# Patient Record
Sex: Female | Born: 1985 | Race: Black or African American | Hispanic: No | Marital: Single | Smoking: Current every day smoker
Health system: Southern US, Community
[De-identification: ages and names within clinical notes are randomized; demographics above are authoritative.]

## PROBLEM LIST (undated history)

## (undated) ENCOUNTER — Inpatient Hospital Stay: Payer: Self-pay

## (undated) DIAGNOSIS — I10 Essential (primary) hypertension: Secondary | ICD-10-CM

## (undated) DIAGNOSIS — F329 Major depressive disorder, single episode, unspecified: Secondary | ICD-10-CM

## (undated) DIAGNOSIS — K219 Gastro-esophageal reflux disease without esophagitis: Secondary | ICD-10-CM

## (undated) DIAGNOSIS — O479 False labor, unspecified: Secondary | ICD-10-CM

## (undated) DIAGNOSIS — R011 Cardiac murmur, unspecified: Secondary | ICD-10-CM

## (undated) DIAGNOSIS — F32A Depression, unspecified: Secondary | ICD-10-CM

## (undated) DIAGNOSIS — A6000 Herpesviral infection of urogenital system, unspecified: Secondary | ICD-10-CM

## (undated) DIAGNOSIS — F419 Anxiety disorder, unspecified: Secondary | ICD-10-CM

## (undated) DIAGNOSIS — R87619 Unspecified abnormal cytological findings in specimens from cervix uteri: Secondary | ICD-10-CM

## (undated) HISTORY — PX: OTHER SURGICAL HISTORY: SHX169

## (undated) HISTORY — PX: DILATION AND CURETTAGE OF UTERUS: SHX78

## (undated) HISTORY — DX: False labor, unspecified: O47.9

## (undated) HISTORY — DX: Herpesviral infection of urogenital system, unspecified: A60.00

---

## 2005-12-26 ENCOUNTER — Emergency Department: Payer: Self-pay | Admitting: Emergency Medicine

## 2005-12-29 ENCOUNTER — Ambulatory Visit: Payer: Self-pay | Admitting: Emergency Medicine

## 2006-03-27 ENCOUNTER — Emergency Department: Payer: Self-pay | Admitting: Emergency Medicine

## 2006-10-12 ENCOUNTER — Emergency Department: Payer: Self-pay | Admitting: Internal Medicine

## 2007-08-07 ENCOUNTER — Emergency Department: Payer: Self-pay | Admitting: Unknown Physician Specialty

## 2007-10-24 ENCOUNTER — Observation Stay: Payer: Self-pay

## 2007-11-16 ENCOUNTER — Observation Stay: Payer: Self-pay

## 2007-12-07 ENCOUNTER — Observation Stay: Payer: Self-pay | Admitting: Obstetrics and Gynecology

## 2007-12-11 ENCOUNTER — Observation Stay: Payer: Self-pay

## 2007-12-11 ENCOUNTER — Inpatient Hospital Stay: Payer: Self-pay | Admitting: Obstetrics and Gynecology

## 2009-07-13 ENCOUNTER — Ambulatory Visit: Payer: Self-pay | Admitting: Internal Medicine

## 2011-05-30 ENCOUNTER — Emergency Department: Payer: Self-pay | Admitting: Unknown Physician Specialty

## 2011-05-31 LAB — URINALYSIS, COMPLETE
Bilirubin,UR: NEGATIVE
Blood: NEGATIVE
Glucose,UR: NEGATIVE mg/dL (ref 0–75)
Ketone: NEGATIVE
Leukocyte Esterase: NEGATIVE
Protein: 30
RBC,UR: NONE SEEN /HPF (ref 0–5)
Specific Gravity: 1.024 (ref 1.003–1.030)
Squamous Epithelial: 11

## 2011-05-31 LAB — CBC
MCHC: 33 g/dL (ref 32.0–36.0)
MCV: 96 fL (ref 80–100)
Platelet: 197 10*3/uL (ref 150–440)
RBC: 3.96 10*6/uL (ref 3.80–5.20)
RDW: 11.7 % (ref 11.5–14.5)

## 2011-05-31 LAB — COMPREHENSIVE METABOLIC PANEL
Albumin: 3.4 g/dL (ref 3.4–5.0)
Alkaline Phosphatase: 67 U/L (ref 50–136)
Anion Gap: 9 (ref 7–16)
BUN: 13 mg/dL (ref 7–18)
Bilirubin,Total: 0.4 mg/dL (ref 0.2–1.0)
Creatinine: 0.68 mg/dL (ref 0.60–1.30)
Glucose: 111 mg/dL — ABNORMAL HIGH (ref 65–99)
Osmolality: 278 (ref 275–301)
Potassium: 3.5 mmol/L (ref 3.5–5.1)
SGPT (ALT): 28 U/L
Sodium: 139 mmol/L (ref 136–145)
Total Protein: 7.8 g/dL (ref 6.4–8.2)

## 2012-10-09 ENCOUNTER — Emergency Department: Payer: Self-pay | Admitting: Emergency Medicine

## 2012-10-09 LAB — URINALYSIS, COMPLETE
Bilirubin,UR: NEGATIVE
Glucose,UR: NEGATIVE mg/dL (ref 0–75)
Ketone: NEGATIVE
Specific Gravity: 1.023 (ref 1.003–1.030)
WBC UR: 138 /HPF (ref 0–5)

## 2013-04-05 ENCOUNTER — Emergency Department: Payer: Self-pay | Admitting: Emergency Medicine

## 2013-08-16 ENCOUNTER — Encounter: Payer: Self-pay | Admitting: Physician Assistant

## 2014-07-21 HISTORY — PX: APPENDECTOMY: SHX54

## 2014-07-24 LAB — URINALYSIS, COMPLETE
BLOOD: NEGATIVE
Bacteria: NONE SEEN
Bilirubin,UR: NEGATIVE
Glucose,UR: NEGATIVE mg/dL (ref 0–75)
KETONE: NEGATIVE
Leukocyte Esterase: NEGATIVE
Nitrite: NEGATIVE
PH: 7 (ref 4.5–8.0)
RBC,UR: 4 /HPF (ref 0–5)
SPECIFIC GRAVITY: 1.027 (ref 1.003–1.030)
Squamous Epithelial: 15
WBC UR: 3 /HPF (ref 0–5)

## 2014-07-24 LAB — CBC WITH DIFFERENTIAL/PLATELET
BASOS ABS: 0 10*3/uL (ref 0.0–0.1)
Basophil %: 0.3 %
Eosinophil #: 0.1 10*3/uL (ref 0.0–0.7)
Eosinophil %: 0.6 %
HCT: 39.2 % (ref 35.0–47.0)
HGB: 13.3 g/dL (ref 12.0–16.0)
LYMPHS PCT: 16.1 %
Lymphocyte #: 2.5 10*3/uL (ref 1.0–3.6)
MCH: 32.8 pg (ref 26.0–34.0)
MCHC: 34 g/dL (ref 32.0–36.0)
MCV: 97 fL (ref 80–100)
MONO ABS: 0.9 x10 3/mm (ref 0.2–0.9)
Monocyte %: 5.7 %
NEUTROS PCT: 77.3 %
Neutrophil #: 11.9 10*3/uL — ABNORMAL HIGH (ref 1.4–6.5)
PLATELETS: 213 10*3/uL (ref 150–440)
RBC: 4.06 10*6/uL (ref 3.80–5.20)
RDW: 13.1 % (ref 11.5–14.5)
WBC: 15.3 10*3/uL — ABNORMAL HIGH (ref 3.6–11.0)

## 2014-07-25 ENCOUNTER — Observation Stay
Admit: 2014-07-25 | Disposition: A | Discharge status: Home / Self Care | Payer: Self-pay | Attending: Surgery | Admitting: Surgery

## 2014-07-25 LAB — COMPREHENSIVE METABOLIC PANEL
ANION GAP: 7 (ref 7–16)
Albumin: 4 g/dL
Alkaline Phosphatase: 56 U/L
BUN: 17 mg/dL
Bilirubin,Total: 0.4 mg/dL
CALCIUM: 9.4 mg/dL
CO2: 25 mmol/L
Chloride: 104 mmol/L
Creatinine: 0.6 mg/dL
EGFR (African American): >60
EGFR (Non-African Amer.): >60
Glucose: 109 mg/dL — ABNORMAL HIGH
Potassium: 3.3 mmol/L — ABNORMAL LOW
SGOT(AST): 22 U/L
SGPT (ALT): 18 U/L
Sodium: 136 mmol/L
Total Protein: 7.6 g/dL

## 2014-08-14 LAB — SURGICAL PATHOLOGY

## 2014-08-20 NOTE — H&P (Signed)
PATIENT NAME:  Karen Wallace, Karen Wallace MR#:  [id number] DATE OF BIRTH:  [birth year]  DATE OF ADMISSION:  07/25/2014  ADMITTING PHYSICIAN:  Quentin Orealph L. Ely III, MD   CHIEF COMPLAINT: Abdominal pain.   BRIEF HISTORY: Karen Wallace is a 29 year old woman seen in the Emergency Room with a less than 24 hour history of abdominal pain. She awoke yesterday morning feeling normal, developed some mild back pain later in the morning followed by some significant right-sided pain. The pain increased over the course of the day. It was not associated with any nausea, vomiting, fever, chills. She did have some anorexia and has not eaten since yesterday morning. She had a loose stool the day before which she related to something she ate. The pain persisted over the course of the day and she presented to the Emergency Room for further evaluation.   She denies a history of hepatitis, yellow jaundice, pancreatitis, peptic ulcer disease, gallbladder disease, or diverticulitis. She has had no previous abdominal surgery. She has had 1 child with normal vaginal delivery and had 1 miscarriage followed by a Wallace and C.   PAST MEDICAL HISTORY AND MEDICATIONS: She has a history of hypertension, currently under therapy. She does not have her medication with her and does not remember the name.   ALLERGIES: SHE FEELS AS THOUGH SHE IS ALLERGIC TO ONE ANTIHYPERTENSIVE MEDICATION, WHICH DID GIVE HER SOME CHRONIC COUGH ISSUES.   SOCIAL HISTORY:  She smokes cigarettes at approximately half a  pack a day and drinks alcohol occasionally, works in a dietary department. She does not do physical labor.   FAMILY HISTORY: Noncontributory to the current problem but is positive for hypertension.   REVIEW OF SYSTEMS: Otherwise unremarkable. Specifically, she has no shortness of breath, chest pain, or urinary symptoms. The current pain is not related to any of her normal menstrual cycle.   PHYSICAL EXAMINATION:  GENERAL: She is alert, a very pleasant  woman lying at rest in bed complaining of minimal abdominal discomfort at the present time. She has been treated with pain medication. Rates her pain as a 0 at rest.  VITAL SIGNS: Blood pressure is 140/80. Heart rate is 92 and regular. She is afebrile.  HEENT: No scleral icterus. No pupillary abnormalities. Normal ears.  NECK: Supple, nontender with midline trachea.  LYMPHATICS: Lymphadenopathy is not present in the axilla or her cervical areas.  CHEST: Clear with no adventitious sounds and she has normal pulmonary excursion.  CARDIAC: No murmurs or gallops to my ear. She seems to be in normal sinus rhythm.  ABDOMEN: Soft with moderate right lower quadrant point tenderness, mild guarding, and mild rebound. She has active bowel sounds.  EXTREMITIES: The lower extremity and upper extremity exam reveal full range of motion, no deformities although she does admit to some numbness in the left leg, which is due to a chronic back condition. She has normal distal pulses.   DICTATION ENDS HERE  see addendum  ____________________________ Carmie Endalph L. Ely III, MD rle:AT Wallace: 07/25/2014 04:49:03 ET T: 07/25/2014 05:34:56 ET JOB#: [id number]  cc: Quentin Orealph L. Ely III, MD, <Dictator> Quentin OreALPH L ELY MD ELECTRONICALLY SIGNED 07/25/2014 19:34

## 2014-08-20 NOTE — Op Note (Signed)
PATIENT NAME:  Karen AbbottROGERS, [name] MR#:  [id number] DATE OF BIRTH:  [birth year]  DATE OF PROCEDURE:  07/25/2014  PREOPERATIVE DIAGNOSIS: Acute appendicitis.   POSTOPERATIVE DIAGNOSIS: Acute appendicitis.   PROCEDURE: Laparoscopic appendectomy.   ANESTHESIA: General with endotracheal tube.   SURGEON:  Dionne Miloichard Damaria Stofko, MD.    ASSISTANT:  Doreene NestElena Klaus, PA-S.   INDICATIONS: This is a patient with progressive right lower quadrant pain and tenderness and workup showing probable appendicitis. Preoperatively we discussed rationale for surgery, the options of observation, risks of bleeding, infection, recurrence, negative laparoscopy, and an open procedure.  This was all reviewed for her. She understood and agreed to proceed.   FINDINGS: Acute appendicitis, nonruptured.   DESCRIPTION OF PROCEDURE: The patient was induced to general anesthesia. She was on IV antibiotics. VTE prophylaxis was in place and Foley catheter was placed. She was then prepped and draped in a sterile fashion. Marcaine was infiltrated in skin and subcutaneous tissues around the periumbilical area. Incision was made. Veress needle was placed. Pneumoperitoneum was obtained. A 5 mm trocar port was placed. The abdominal cavity was explored and under direct vision a 5 mm suprapubic port and left lateral 12 mm port was placed. The appendix was identified in the right lower quadrant. It was elevated. The base of the appendix was handled with a standard load Endo GIA and then 2 firings of the vascular load. Endo GIA was utilized to control and divide the mesoappendix. There was bleeding from the staple line, therefore clips were utilized. The specimen was passed out through the lateral port site with the aid of an Endo Catch bag. The area was irrigated with copious amounts of normal saline, which was aspirated. There was no further bleeding. Therefore pneumoperitoneum was maintained while closing the left lateral port site under direct vision with  an Endo Close technique and 0 Vicryl sutures. Again hemostasis was checked and found to be adequate. No other pathology was noted.  Pneumoperitoneum was released. All ports were removed. 4-0 subcuticular Monocryl was used on all skin edges. Steri-Strips, Mastisol, and sterile dressings were placed.   The patient tolerated the procedure well. There were no complications. She was taken to the recovery room in stable condition, to be admitted for continued care.    ____________________________ Adah Salvageichard E. Excell Seltzerooper, MD rec:bu D: 07/25/2014 12:19:35 ET T: 07/25/2014 13:24:18 ET JOB#: [id number]  cc: Adah Salvageichard E. Excell Seltzerooper, MD, <Dictator> Lattie HawICHARD E Landyn Buckalew MD ELECTRONICALLY SIGNED 07/25/2014 19:00

## 2014-08-20 NOTE — Discharge Summary (Signed)
PATIENT NAME:  Karen Wallace, Karen D MR#:  [id number] DATE OF BIRTH:  02-Dec-1985  DATE OF ADMISSION:  07/25/2014 DATE OF DISCHARGE:  07/26/2014  DIAGNOSES: Acute appendicitis.   PROCEDURE: Laparoscopic appendectomy.   HISTORY OF PRESENT ILLNESS AND HOSPITAL COURSE: This is a patient with progressive right lower quadrant pain and tenderness, and a work-up showing acute appendicitis. She was taken to the operating room, where a laparoscopic appendectomy was performed. She made an uncomplicated postoperative recovery, is tolerating a regular diet, is discharged on oral analgesics, with instructions to remove her dressing and shower the next day and to follow up in our office in 10 days.     ____________________________ Adah Salvageichard E. Excell Seltzerooper, MD rec:mw D: 08/02/2014 14:23:49 ET T: 08/02/2014 17:00:54 ET JOB#: [id number]  cc: Adah Salvageichard E. Excell Seltzerooper, MD, <Dictator> Lattie HawICHARD E Damontre Millea MD ELECTRONICALLY SIGNED 08/07/2014 20:37

## 2014-08-20 NOTE — H&P (Signed)
PATIENT NAME:  Karen Wallace, Karen Wallace MR#:  [id number] DATE OF BIRTH:  [birth year]  DATE OF ADMISSION:  07/25/2014  ADDENDUM   NEUROLOGIC: No evidence for any cranial nerve abnormalities, and she has equal symmetrical bilateral reflexes.  PSYCHIATRIC: Normal orientation. Normal affect.  SKIN:  Multiple tattoos but no masses, abrasions, contusions.   LABORATORY DATA: Workup in the Emergency Room revealed white blood cell count of 15,000. The only electrolyte abnormality was a potassium of 3.3. CT exam revealed dilated appendix 10 mm with some moderate periappendiceal stranding suggestive of acute appendicitis.   ASSESSMENT AND PLAN: I independently reviewed her CT scan. Her imaging, physical examination, and history are consistent with early appendicitis. She is symptomatic. 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 In this setting we would recommend admission to the hospital and consideration for surgical intervention. I talked with her about the options of antibiotic therapy versus surgical intervention. We outlined the risks of possible rupture and the sequelae from ruptured appendicitis. She would like to consider surgery. We will set her up for surgery later this morning.    ____________________________ Carmie Endalph L. Ely III, MD rle:AT Wallace: 07/25/2014 04:51:08 ET T: 07/25/2014 05:48:38 ET JOB#: [id number]  cc: Quentin Orealph L. Ely III, MD, <Dictator> Quentin OreALPH L ELY MD ELECTRONICALLY SIGNED 07/25/2014 19:34

## 2014-10-05 LAB — OB RESULTS CONSOLE HEPATITIS B SURFACE ANTIGEN: Hepatitis B Surface Ag: NEGATIVE

## 2014-10-05 LAB — OB RESULTS CONSOLE HIV ANTIBODY (ROUTINE TESTING): HIV: NONREACTIVE

## 2014-11-26 ENCOUNTER — Encounter: Payer: Self-pay | Admitting: Emergency Medicine

## 2014-11-26 ENCOUNTER — Emergency Department
Admission: EM | Admit: 2014-11-26 | Discharge: 2014-11-26 | Disposition: A | Discharge status: Home / Self Care | Payer: Medicaid Other | Attending: Student | Admitting: Student

## 2014-11-26 DIAGNOSIS — E860 Dehydration: Secondary | ICD-10-CM

## 2014-11-26 DIAGNOSIS — R531 Weakness: Secondary | ICD-10-CM | POA: Insufficient documentation

## 2014-11-26 DIAGNOSIS — O23591 Infection of other part of genital tract in pregnancy, first trimester: Secondary | ICD-10-CM | POA: Diagnosis not present

## 2014-11-26 DIAGNOSIS — Z3A13 13 weeks gestation of pregnancy: Secondary | ICD-10-CM | POA: Diagnosis not present

## 2014-11-26 DIAGNOSIS — F17210 Nicotine dependence, cigarettes, uncomplicated: Secondary | ICD-10-CM | POA: Insufficient documentation

## 2014-11-26 DIAGNOSIS — O10011 Pre-existing essential hypertension complicating pregnancy, first trimester: Secondary | ICD-10-CM | POA: Diagnosis not present

## 2014-11-26 DIAGNOSIS — N760 Acute vaginitis: Secondary | ICD-10-CM

## 2014-11-26 DIAGNOSIS — O211 Hyperemesis gravidarum with metabolic disturbance: Secondary | ICD-10-CM | POA: Insufficient documentation

## 2014-11-26 DIAGNOSIS — O99331 Smoking (tobacco) complicating pregnancy, first trimester: Secondary | ICD-10-CM | POA: Diagnosis not present

## 2014-11-26 DIAGNOSIS — O9989 Other specified diseases and conditions complicating pregnancy, childbirth and the puerperium: Secondary | ICD-10-CM | POA: Diagnosis present

## 2014-11-26 DIAGNOSIS — B9689 Other specified bacterial agents as the cause of diseases classified elsewhere: Secondary | ICD-10-CM

## 2014-11-26 DIAGNOSIS — R42 Dizziness and giddiness: Secondary | ICD-10-CM

## 2014-11-26 HISTORY — DX: Essential (primary) hypertension: I10

## 2014-11-26 LAB — CBC
HCT: 33.4 % — ABNORMAL LOW (ref 35.0–47.0)
Hemoglobin: 11.9 g/dL — ABNORMAL LOW (ref 12.0–16.0)
MCH: 33.4 pg (ref 26.0–34.0)
MCHC: 35.5 g/dL (ref 32.0–36.0)
MCV: 93.9 fL (ref 80.0–100.0)
Platelets: 248 10*3/uL (ref 150–440)
RBC: 3.56 MIL/uL — AB (ref 3.80–5.20)
RDW: 13.1 % (ref 11.5–14.5)
WBC: 12.4 10*3/uL — ABNORMAL HIGH (ref 3.6–11.0)

## 2014-11-26 LAB — URINALYSIS COMPLETE WITH MICROSCOPIC (ARMC ONLY)
Bilirubin Urine: NEGATIVE
GLUCOSE, UA: NEGATIVE mg/dL
Hgb urine dipstick: NEGATIVE
NITRITE: NEGATIVE
PH: 7 (ref 5.0–8.0)
PROTEIN: 30 mg/dL — AB
Specific Gravity, Urine: 1.024 (ref 1.005–1.030)

## 2014-11-26 LAB — COMPREHENSIVE METABOLIC PANEL
ALBUMIN: 3.7 g/dL (ref 3.5–5.0)
ALT: 21 U/L (ref 14–54)
ANION GAP: 9 (ref 5–15)
AST: 19 U/L (ref 15–41)
Alkaline Phosphatase: 45 U/L (ref 38–126)
BUN: 13 mg/dL (ref 6–20)
CO2: 21 mmol/L — AB (ref 22–32)
Calcium: 9.7 mg/dL (ref 8.9–10.3)
Chloride: 103 mmol/L (ref 101–111)
Creatinine, Ser: 0.46 mg/dL (ref 0.44–1.00)
GFR calc Af Amer: >60 mL/min (ref >60)
GFR calc non Af Amer: >60 mL/min (ref >60)
Glucose, Bld: 86 mg/dL (ref 65–99)
Potassium: 3.9 mmol/L (ref 3.5–5.1)
SODIUM: 133 mmol/L — AB (ref 135–145)
Total Bilirubin: 0.3 mg/dL (ref 0.3–1.2)
Total Protein: 7 g/dL (ref 6.5–8.1)

## 2014-11-26 LAB — CHLAMYDIA/NGC RT PCR (ARMC ONLY)
Chlamydia Tr: NOT DETECTED
N GONORRHOEAE: NOT DETECTED

## 2014-11-26 LAB — HCG, QUANTITATIVE, PREGNANCY: hCG, Beta Chain, Quant, S: 112375 m[IU]/mL — ABNORMAL HIGH (ref <5)

## 2014-11-26 LAB — LIPASE, BLOOD: Lipase: 18 U/L — ABNORMAL LOW (ref 22–51)

## 2014-11-26 LAB — WET PREP, GENITAL
Trich, Wet Prep: NONE SEEN
Yeast Wet Prep HPF POC: NONE SEEN

## 2014-11-26 MED ORDER — DEXTROSE 5 % AND 0.9 % NACL IV BOLUS
500.0000 mL | Freq: Once | INTRAVENOUS | Status: AC
  Administered 2014-11-26: 500 mL via INTRAVENOUS
  Filled 2014-11-26: qty 1000
  Filled 2014-11-26: qty 500

## 2014-11-26 MED ORDER — SODIUM CHLORIDE 0.9 % IV BOLUS (SEPSIS)
1000.0000 mL | Freq: Once | INTRAVENOUS | Status: AC
  Administered 2014-11-26: 1000 mL via INTRAVENOUS

## 2014-11-26 MED ORDER — METRONIDAZOLE 500 MG PO TABS: 500.0000 mg | ORAL_TABLET | Freq: Two times a day (BID) | ORAL | Status: DC

## 2014-11-26 MED ORDER — ACETAMINOPHEN 500 MG PO TABS
1000.0000 mg | ORAL_TABLET | Freq: Once | ORAL | Status: AC
  Administered 2014-11-26: 1000 mg via ORAL
  Filled 2014-11-26: qty 2

## 2014-11-26 NOTE — ED Notes (Signed)
Pt with be d/c once fluids finish per MD gayle. Pt made aware and verbalized understanding at this time. No further orders at this time.

## 2014-11-26 NOTE — ED Provider Notes (Signed)
[facility] Emergency Department Provider Note  ____________________________________________  Time seen: Approximately 3:07 PM  I have reviewed the triage vital signs and the nursing notes.   HISTORY  Chief Complaint Pelvic Pain; Abdominal Pain; and Weakness    HPI Karen Wallace is a 29 y.o. female G3 P1 at 38 weeks estimated gestational age by dementia and first trimester ultrasound performed on 11/23/14 who presents for evaluation of 2 days of generalized weakness, fatigue, lightheadedness, generally feeling poor, gradual onset. She has had white vaginal discharge which she is concerned may be related to a yeast infection. She has had intermittent lower abdominal pains ongoing for several weeks. No abnormal vaginal bleeding. She had one episode of nonbloody nonbilious emesis today; she has had morning sickness throughout this pregnancy. No diarrhea, no fevers or chills. Current severity of symptoms is moderate. No chest pain, no difficulty breathing. No modifying factors.   Past Medical History  Diagnosis Date  . Hypertension     There are no active problems to display for this patient.   Past Surgical History  Procedure Laterality Date  . Appendectomy  april 2016    No current outpatient prescriptions on file.  Allergies Review of patient's allergies indicates no known allergies.  No family history on file.  Social History History  Substance Use Topics  . Smoking status: Current Every Day Smoker    Types: Cigarettes  . Smokeless tobacco: Not on file  . Alcohol Use: No    Review of Systems Constitutional: No fever/chills Eyes: No visual changes. ENT: No sore throat. Cardiovascular: Denies chest pain. Respiratory: Denies shortness of breath. Gastrointestinal: + abdominal pain.  + nausea, + vomiting.  No diarrhea.  No constipation. Genitourinary: Negative for dysuria. Musculoskeletal: Negative for back pain. Skin: Negative for  rash. Neurological: Negative for headaches, focal weakness or numbness.  10-point ROS otherwise negative.  ____________________________________________   PHYSICAL EXAM:  VITAL SIGNS: ED Triage Vitals  Enc Vitals Group     BP 11/26/14 1423 129/79 mmHg     Pulse Rate 11/26/14 1423 101     Resp 11/26/14 1423 16     Temp 11/26/14 1423 98.3 F (36.8 C)     Temp Source 11/26/14 1423 Oral     SpO2 11/26/14 1423 97 %     Weight 11/26/14 1423 203 lb (92.08 kg)     Height 11/26/14 1423  (1.727 m)     Head Cir --      Peak Flow --      Pain Score 11/26/14 1424 6     Pain Loc --      Pain Edu? --      Excl. in GC? --     Constitutional: Alert and oriented. Well appearing and in no acute distress. Eyes: Conjunctivae are normal. PERRL. EOMI. Head: Atraumatic. Nose: No congestion/rhinnorhea. Mouth/Throat: Mucous membranes are moist.  Oropharynx non-erythematous. Neck: No stridor.  Cardiovascular: Normal rate, regular rhythm. Grossly normal heart sounds.  Good peripheral circulation. Respiratory: Normal respiratory effort.  No retractions. Lungs CTAB. Gastrointestinal: Soft and nontender. No distention. No abdominal bruits. No CVA tenderness. Pelvic:: Thick white  discharge from close os. No CMT or BMT. Musculoskeletal: No lower extremity tenderness nor edema.  No joint effusions. Neurologic:  Normal speech and language. No gross focal neurologic deficits are appreciated. No gait instability. Skin:  Skin is warm, dry and intact. No rash noted. Psychiatric: Mood and affect are normal. Speech and behavior are normal.  ____________________________________________  LABS (all labs ordered are listed, but only abnormal results are displayed)  Labs Reviewed  LIPASE, BLOOD - Abnormal; Notable for the following:    Lipase 18 (*)    All other components within normal limits  COMPREHENSIVE METABOLIC PANEL - Abnormal; Notable for the following:    Sodium 133 (*)    CO2 21 (*)     All other components within normal limits  CBC - Abnormal; Notable for the following:    WBC 12.4 (*)    RBC 3.56 (*)    Hemoglobin 11.9 (*)    HCT 33.4 (*)    All other components within normal limits  HCG, QUANTITATIVE, PREGNANCY - Abnormal; Notable for the following:    hCG, Beta Chain, [name] [id number] (*)    All other components within normal limits  URINALYSIS COMPLETEWITH MICROSCOPIC ([facility] ONLY) - Abnormal; Notable for the following:    Color, Urine YELLOW (*)    APPearance CLOUDY (*)    Ketones, ur 2+ (*)    Protein, ur 30 (*)    Leukocytes, UA 1+ (*)    Bacteria, UA RARE (*)    Squamous Epithelial / LPF 6-30 (*)    All other components within normal limits  WET PREP, GENITAL   ____________________________________________  EKG  none ____________________________________________  RADIOLOGY  Limited emergency department bedside ultrasound performed by me shows single live intrauterine pregnancy with fetal heart rate 167 bpm. ____________________________________________   PROCEDURES  Procedure(s) performed: None  Critical Care performed: No  ____________________________________________   INITIAL IMPRESSION / ASSESSMENT AND PLAN / ED COURSE  Pertinent labs & imaging results that were available during my care of the patient were reviewed by me and considered in my medical decision making (see chart for details).  Karen Wallace is a 29 y.o. female G3 P1 at 58 weeks estimated gestational age by dementia. An first trimester ultrasound who presents for evaluation of 2 days of generalized weakness, fatigue, lightheadedness, generally feeling poor. On exam, she is generally well-appearing and in no acute distress. Mildly tachycardic on arrival however this has resolved at this time. She has a completely benign abdominal examination. Labs are notable for chronic leukocytosis which is stable, improved from prior. Mild hyponatremia. Mild anemia. Urinalysis contaminated  with multiple squamous cells, 1+ leuk esterase and rare bacteria, not consistent with infection, culture sent. She has a 2+ ketones in her urine. I suspect mild dehydration as a cause of her symptoms. We'll give IV fluids containing dextrose, Tylenol for any discomfort. We will perform a pelvic exam and anticipate discharge with OB/GYN follow-up.  ----------------------------------------- 5:46 PM on 11/26/2014 -----------------------------------------  Patient with improvement of her symptoms after IV fluids. Wet prep consistent with BV and since she is symptomatic, will treat with Flagyl. Discharge with return precautions, close OB/GYN follow-up, instructions to push fluids orally. She is comfortable with the discharge plan. GC chlamydia pending. ____________________________________________   FINAL CLINICAL IMPRESSION(S) / ED DIAGNOSES  Final diagnoses:  Lightheadedness  Dehydration      Gayla Doss, MD 11/26/14 [phone]

## 2014-11-26 NOTE — ED Notes (Addendum)
Pt reports she is [redacted] weeks pregnant, reports lower abdominal pain and pelvic pain today. Pt reports weakness since Friday. Pt tearful in triage, states "I just don't feel good". Pt reports decreased appetite, reports nausea and vomiting. Pt also reports white vaginal discharge with itching that started this morning.

## 2014-11-26 NOTE — ED Notes (Signed)
MD Gayle at bedside. 

## 2014-11-26 NOTE — ED Notes (Signed)
Pt ambulatory to bathroom at this time with no concerns.  

## 2014-11-26 NOTE — ED Notes (Signed)
Pharmacy called regarding dextrose, will send to ED

## 2015-04-11 ENCOUNTER — Observation Stay
Admission: EM | Admit: 2015-04-11 | Discharge: 2015-04-11 | Disposition: A | Discharge status: Home / Self Care | Payer: Medicaid Other | Attending: Certified Nurse Midwife | Admitting: Certified Nurse Midwife

## 2015-04-11 ENCOUNTER — Encounter: Payer: Self-pay | Admitting: *Deleted

## 2015-04-11 DIAGNOSIS — Z3A33 33 weeks gestation of pregnancy: Secondary | ICD-10-CM | POA: Diagnosis not present

## 2015-04-11 DIAGNOSIS — O4700 False labor before 37 completed weeks of gestation, unspecified trimester: Secondary | ICD-10-CM | POA: Diagnosis present

## 2015-04-11 DIAGNOSIS — O479 False labor, unspecified: Secondary | ICD-10-CM | POA: Diagnosis present

## 2015-04-11 HISTORY — DX: False labor before 37 completed weeks of gestation, unspecified trimester: O47.00

## 2015-04-11 LAB — URINALYSIS COMPLETE WITH MICROSCOPIC (ARMC ONLY)
BILIRUBIN URINE: NEGATIVE
Bacteria, UA: NONE SEEN
Glucose, UA: NEGATIVE mg/dL
Hgb urine dipstick: NEGATIVE
Ketones, ur: NEGATIVE mg/dL
Leukocytes, UA: NEGATIVE
Nitrite: NEGATIVE
Protein, ur: NEGATIVE mg/dL
RBC / HPF: NONE SEEN RBC/hpf (ref 0–5)
Specific Gravity, Urine: 1.015 (ref 1.005–1.030)
pH: 7 (ref 5.0–8.0)

## 2015-04-11 LAB — FETAL FIBRONECTIN: FETAL FIBRONECTIN: NEGATIVE

## 2015-04-11 LAB — CHLAMYDIA/NGC RT PCR (ARMC ONLY)
CHLAMYDIA TR: NOT DETECTED
N GONORRHOEAE: NOT DETECTED

## 2015-04-11 MED ORDER — LACTATED RINGERS IV SOLN: 500.0000 mL | INTRAVENOUS | Status: DC | PRN

## 2015-04-11 NOTE — Progress Notes (Addendum)
Final L&D Progress Note  \  29 year old G3 P1011 BF with EDC=05/30/2015 by LMP=08/23/2014 and c/w a 9wk2d ultrasound presented at 33 weeks with c/o contractions that started around 10 o'clock while at work. Rated the pain at 7/10. Pain consists of intermittent tightening and pressure.  Has had no vaginal bleeding, LOF, dysuria, vulvar itching, or vaginal discharge. Baby active.  [facility] for a hx of CHTN (normotensive off meds during pregnancy), hx of anxiety and depression, and obesity with beginning BMI=32 and a net weight loss of 9#.  Numerous c/o normal discomforts of pregnancy i.e heartburn, leg cramps, SP pain, pelvic laxity.  Exam: General: in NAD BP 127/68 mmHg  Pulse 103  Temp(Src) 97.9 F (36.6 C) (Oral)  Abdomen: soft, tender FHR: 135-140 baseline with accelerations to 160s to 170, moderate variability,  Toco: initially ctxs q3-8 min apart with uterine irritability between ctxs. The contractions spaced out with oral hydration.  3 ctxs in the last hour Spec exam: mucoid discharge in vagina. Wet prep negative for hyphae, Trich, clue cells FFN and Aptima obtained Cervix: closed/30%/OOP  Results for orders placed or performed during the hospital encounter of 04/11/15 (from the past 24 hour(s))  Urinalysis complete, with microscopic ([facility] only)     Status: Abnormal   Collection Time: 04/11/15  3:05 PM  Result Value Ref Range   Color, Urine YELLOW (A) YELLOW   APPearance CLOUDY (A) CLEAR   Glucose, UA NEGATIVE NEGATIVE mg/dL   Bilirubin Urine NEGATIVE NEGATIVE   Ketones, ur NEGATIVE NEGATIVE mg/dL   Specific Gravity, Urine 1.015 1.005 - 1.030   Hgb urine dipstick NEGATIVE NEGATIVE   pH 7.0 5.0 - 8.0   Protein, ur NEGATIVE NEGATIVE mg/dL   Nitrite NEGATIVE NEGATIVE   Leukocytes, UA NEGATIVE NEGATIVE   RBC / HPF NONE SEEN 0 - 5 RBC/hpf   WBC, UA 0-5 0 - 5 WBC/hpf   Bacteria, UA NONE SEEN NONE SEEN   Squamous Epithelial / LPF 6-30 (A) NONE SEEN   Mucous  PRESENT    Amorphous Crystal PRESENT   Fetal fibronectin     Status: Abnormal   Collection Time: 04/11/15  6:00 PM  Result Value Ref Range   Fetal Fibronectin NEGATIVE NEGATIVE   Appearance, FETFIB COLORLESS (A) CLEAR  Chlamydia/NGC rt PCR ([facility] only)     Status: None   Collection Time: 04/11/15  6:00 PM  Result Value Ref Range   Specimen source GC/Chlam ENDOCERVICAL    Chlamydia Tr NOT DETECTED NOT DETECTED   N gonorrhoeae NOT DETECTED NOT DETECTED   A: IUP at 33 weeks with preterm contractions-now resolved with hydration  P: Discharge home with labor precautions Recommend maternity support at work and increased hydration with non caffeine fluids FU at office as scheduled 3 Jan and prn  Farrel ConnersGUTIERREZ, [place], CNM

## 2015-04-11 NOTE — OB Triage Note (Signed)
33w G3P1 [facility] patient c/o intermittent abdominal pain and tightening that comes in intervals. Rates pain 7/10. No bleeding, LOF, +FM.

## 2015-04-11 NOTE — Discharge Instructions (Signed)
Drink plenty of water and get plenty of rest. Wear a maternity belt at work for more support.

## 2015-04-22 NOTE — L&D Delivery Note (Addendum)
Delivery Note At 10:09 PM a viable female was delivered via Vaginal, Spontaneous Delivery (Presentation: OA to LOA).  APGAR: 8, 9; weight 7 lb 7 oz (3374 g).   Placenta status: Intact, Spontaneous.  Cord: 3 vessels with the following complications: None.  Cord PH: NA  Called to see patient.  Mom pushed to delivery viable female infant.  The head followed by compound left hand and posterior shoulder, which delivered without difficulty, and the rest of the body.  No nuchal cord noted.  Baby to mom's chest.  Cord clamped and cut after > 1 min delay.  Cord blood obtained.  Placenta delivered spontaneously, intact, with a 3-vessel cord.  Bilateral labial scrapes hemostatic without repair.  All counts correct.  Hemostasis obtained with IV pitocin and fundal massage. EBL 200 mL.    Anesthesia: Epidural  Episiotomy: None Lacerations: bilateral hemostatic labial scrapes Suture Repair: No repair Est. Blood Loss (mL): 200 ml  Mom to postpartum.  Baby to Couplet care / Skin to Skin.  Tresea Mall, CNM 05/24/2015, 10:45 PM  This patient and plan were discussed with Dr Elesa Massed 05/24/2015

## 2015-05-23 ENCOUNTER — Encounter: Payer: Self-pay | Admitting: Certified Nurse Midwife

## 2015-05-23 ENCOUNTER — Inpatient Hospital Stay
Admission: EM | Admit: 2015-05-23 | Discharge: 2015-05-26 | DRG: 774 | Disposition: A | Discharge status: Home / Self Care | Payer: Medicaid Other | Attending: Obstetrics & Gynecology | Admitting: Obstetrics & Gynecology

## 2015-05-23 DIAGNOSIS — Z3A39 39 weeks gestation of pregnancy: Secondary | ICD-10-CM

## 2015-05-23 DIAGNOSIS — O1092 Unspecified pre-existing hypertension complicating childbirth: Principal | ICD-10-CM | POA: Diagnosis present

## 2015-05-23 DIAGNOSIS — Z23 Encounter for immunization: Secondary | ICD-10-CM | POA: Diagnosis not present

## 2015-05-23 DIAGNOSIS — O403XX0 Polyhydramnios, third trimester, not applicable or unspecified: Secondary | ICD-10-CM | POA: Diagnosis present

## 2015-05-23 DIAGNOSIS — O10913 Unspecified pre-existing hypertension complicating pregnancy, third trimester: Secondary | ICD-10-CM | POA: Diagnosis present

## 2015-05-23 HISTORY — DX: Depression, unspecified: F32.A

## 2015-05-23 HISTORY — DX: Major depressive disorder, single episode, unspecified: F32.9

## 2015-05-23 HISTORY — DX: Anxiety disorder, unspecified: F41.9

## 2015-05-23 HISTORY — DX: Unspecified abnormal cytological findings in specimens from cervix uteri: R87.619

## 2015-05-23 LAB — COMPREHENSIVE METABOLIC PANEL
ALK PHOS: 138 U/L — AB (ref 38–126)
ALT: 19 U/L (ref 14–54)
ANION GAP: 7 (ref 5–15)
AST: 29 U/L (ref 15–41)
Albumin: 3.1 g/dL — ABNORMAL LOW (ref 3.5–5.0)
BILIRUBIN TOTAL: 0.4 mg/dL (ref 0.3–1.2)
BUN: 7 mg/dL (ref 6–20)
CALCIUM: 9.2 mg/dL (ref 8.9–10.3)
CO2: 21 mmol/L — ABNORMAL LOW (ref 22–32)
CREATININE: 0.5 mg/dL (ref 0.44–1.00)
Chloride: 107 mmol/L (ref 101–111)
GFR calc non Af Amer: >60 mL/min (ref >60)
GLUCOSE: 98 mg/dL (ref 65–99)
Potassium: 3.9 mmol/L (ref 3.5–5.1)
Sodium: 135 mmol/L (ref 135–145)
TOTAL PROTEIN: 7.1 g/dL (ref 6.5–8.1)

## 2015-05-23 LAB — CBC
HEMATOCRIT: 33 % — AB (ref 35.0–47.0)
HEMOGLOBIN: 11.5 g/dL — AB (ref 12.0–16.0)
MCH: 34.1 pg — ABNORMAL HIGH (ref 26.0–34.0)
MCHC: 34.8 g/dL (ref 32.0–36.0)
MCV: 98 fL (ref 80.0–100.0)
PLATELETS: 246 10*3/uL (ref 150–440)
RBC: 3.37 MIL/uL — AB (ref 3.80–5.20)
RDW: 13.3 % (ref 11.5–14.5)
WBC: 11 10*3/uL (ref 3.6–11.0)

## 2015-05-23 LAB — PROTEIN / CREATININE RATIO, URINE
Creatinine, Urine: 181 mg/dL
PROTEIN CREATININE RATIO: 0.25 mg/mg{creat} — AB (ref 0.00–0.15)
Total Protein, Urine: 45 mg/dL

## 2015-05-23 MED ORDER — LACTATED RINGERS IV SOLN
INTRAVENOUS | Status: DC
  Administered 2015-05-23: 23:00:00 via INTRAVENOUS

## 2015-05-23 MED ORDER — TERBUTALINE SULFATE 1 MG/ML IJ SOLN: 0.2500 mg | Freq: Once | INTRAMUSCULAR | Status: DC | PRN

## 2015-05-23 MED ORDER — ZOLPIDEM TARTRATE 5 MG PO TABS
5.0000 mg | ORAL_TABLET | Freq: Every evening | ORAL | Status: DC | PRN
  Administered 2015-05-23: 5 mg via ORAL
  Filled 2015-05-23: qty 1

## 2015-05-23 MED ORDER — LIDOCAINE HCL (PF) 1 % IJ SOLN: 30.0000 mL | INTRAMUSCULAR | Status: DC | PRN

## 2015-05-23 MED ORDER — ONDANSETRON HCL 4 MG/2ML IJ SOLN
4.0000 mg | Freq: Four times a day (QID) | INTRAMUSCULAR | Status: DC | PRN
  Administered 2015-05-24: 4 mg via INTRAVENOUS
  Filled 2015-05-23: qty 2

## 2015-05-23 MED ORDER — OXYTOCIN 40 UNITS IN LACTATED RINGERS INFUSION - SIMPLE MED
2.5000 [IU]/h | INTRAVENOUS | Status: DC
  Administered 2015-05-24: 39.96 [IU]/h via INTRAVENOUS
  Filled 2015-05-23: qty 1000

## 2015-05-23 MED ORDER — MISOPROSTOL 200 MCG PO TABS
800.0000 ug | ORAL_TABLET | Freq: Once | ORAL | Status: DC | PRN
  Filled 2015-05-23: qty 4

## 2015-05-23 MED ORDER — DINOPROSTONE 10 MG VA INST
10.0000 mg | VAGINAL_INSERT | Freq: Once | VAGINAL | Status: AC
  Administered 2015-05-23: 10 mg via VAGINAL
  Filled 2015-05-23: qty 1

## 2015-05-23 MED ORDER — AMMONIA AROMATIC IN INHA: 0.3000 mL | Freq: Once | RESPIRATORY_TRACT | Status: DC | PRN

## 2015-05-23 MED ORDER — OXYTOCIN BOLUS FROM INFUSION: 500.0000 mL | INTRAVENOUS | Status: DC

## 2015-05-23 MED ORDER — LACTATED RINGERS IV SOLN: 500.0000 mL | INTRAVENOUS | Status: DC | PRN

## 2015-05-23 NOTE — H&P (Addendum)
Date of Initial H&P: 14 May 2015  History reviewed, patient examined, no changes to H&P except as noted below:  30 year old G3 P1011 with an EDC=05/30/2015 by LMP=08/23/2014 and confirmed with a 9wk2d ultrasound presents for an induction of labor at 39 weeks due to a history of CHTN and for polyhydraminos. Was on procardia prior to pregnancy which was discontinued with +UPT. She had some mild range blood pressures in her first trimester, but has been normotensive in her second and third trimesters. Growth scan at 32 weeks was in the 42%. Polyhydraminos was first noted at 37 weeks when her AFI was 27.97cm. Please see her H&P in her chart for further details. Desires to breast feed. Minipill for [facility] pp.  ROS: Has had more contractions, but denies vaginal bleeding or leakage of fluid. Baby active.  Labs: O POS/ RI/ VI/ GBS negative. TDAP UTD.  O: General : appears anxious, but in NAD Temp(Src) 98.1 F (36.7 C) (Oral)  Resp 18 BP: 143/83 FHR 130s with accelerations to 150s to 160s Toco: contractions q 3-7 min with some coupling, inconsistent duration, mild [place]: cephalic/ OP Cervix: 1+/60-70%/-3 and ballotable  A: IUP at 39 weeks with CHTN/ polyhydraminos for IOL  P: Admit to L&D CBC, T&S, CMP, P/C ratio Monitor blood pressures closely. BP slightly elevated probably due to anxiety Plan Cervidil tonight-discussed induction with cervidil, Pitocin, Cytotec and is aware of risks of hyperstimulation, FITL, FTP, failed induction and Cesarean section. Patient wishes to proceed with induction GBS negative-PPX not needed Breast/ minipill  Taffie Eckmann, CNM .

## 2015-05-24 ENCOUNTER — Inpatient Hospital Stay: Payer: Medicaid Other | Admitting: Certified Registered Nurse Anesthetist

## 2015-05-24 LAB — TYPE AND SCREEN
ABO/RH(D): O POS
ANTIBODY SCREEN: NEGATIVE

## 2015-05-24 LAB — ABO/RH: ABO/RH(D): O POS

## 2015-05-24 MED ORDER — BENZOCAINE-MENTHOL 20-0.5 % EX AERO
1.0000 "application " | INHALATION_SPRAY | CUTANEOUS | Status: DC | PRN
  Administered 2015-05-25: 1 via TOPICAL
  Filled 2015-05-24: qty 56

## 2015-05-24 MED ORDER — MISOPROSTOL 200 MCG PO TABS
ORAL_TABLET | ORAL | Status: AC
  Filled 2015-05-24: qty 4

## 2015-05-24 MED ORDER — OXYTOCIN 10 UNIT/ML IJ SOLN
INTRAMUSCULAR | Status: AC
  Filled 2015-05-24: qty 2

## 2015-05-24 MED ORDER — FENTANYL 2.5 MCG/ML W/ROPIVACAINE 0.2% IN NS 100 ML EPIDURAL INFUSION (ARMC-ANES)
EPIDURAL | Status: AC
  Administered 2015-05-24: 10 mL/h via EPIDURAL
  Filled 2015-05-24: qty 100

## 2015-05-24 MED ORDER — AMMONIA AROMATIC IN INHA
RESPIRATORY_TRACT | Status: AC
  Filled 2015-05-24: qty 10

## 2015-05-24 MED ORDER — IBUPROFEN 600 MG PO TABS
600.0000 mg | ORAL_TABLET | Freq: Four times a day (QID) | ORAL | Status: DC
  Administered 2015-05-25 – 2015-05-26 (×6): 600 mg via ORAL
  Filled 2015-05-24 (×6): qty 1

## 2015-05-24 MED ORDER — BUPIVACAINE HCL (PF) 0.25 % IJ SOLN
INTRAMUSCULAR | Status: DC | PRN
  Administered 2015-05-24 (×2): 4 mL via EPIDURAL

## 2015-05-24 MED ORDER — LIDOCAINE-EPINEPHRINE (PF) 1.5 %-1:200000 IJ SOLN
INTRAMUSCULAR | Status: DC | PRN
  Administered 2015-05-24: 3 mL via EPIDURAL
  Administered 2015-05-24: 3 mL via PERINEURAL

## 2015-05-24 MED ORDER — LIDOCAINE HCL (PF) 1 % IJ SOLN
INTRAMUSCULAR | Status: AC
  Administered 2015-05-24: 3 mL via SUBCUTANEOUS
  Filled 2015-05-24: qty 30

## 2015-05-24 NOTE — Progress Notes (Signed)
Delivery female infant without complications, crying, delayed cord clamp, transition RN present for delivery. Infant dried and stimulated, crying, placed on mom chest. apgar 8/9

## 2015-05-24 NOTE — Discharge Summary (Signed)
OB Discharge Summary  Patient Name: Karen Wallace DOB: March 02, 1986 MRN: [id number]  Date of admission: 05/23/2015 Delivering MD: Tresea Mall, CNM Date of Delivery: 05/24/2015  Date of discharge: 05/24/2015  Admitting diagnosis: 39 weeks - induction Intrauterine pregnancy: [redacted]w[redacted]d     Secondary diagnosis: Polyhydramnios, Pt has had diagnosis of chronic hypertension but has been normo tensive throughout pregnancy and intrapartum.     Discharge diagnosis: Term Pregnancy Delivered                                                                                                Post partum procedures:none  Augmentation: AROM  Complications: None  Hospital course:  Onset of Labor With Vaginal Delivery     30 y.o. yo G3P1011 at [redacted]w[redacted]d was admitted in Latent Labor on 05/23/2015. Patient had an uncomplicated labor course as follows:  Membrane Rupture Time/Date: 1:30 PM ,05/24/2015   Intrapartum Procedures: Episiotomy: None [1]                                         Lacerations:     Patient had a delivery of a Viable  Female infant 05/24/15 at 2209. Apgars 8 and 9. Weight 3374 g  Information for the patient's newborn:  Kristena, Wilhelmi [[id number]]  Delivery Method: Vaginal, Spontaneous Delivery (Filed from Delivery Summary)     Pateint had an uncomplicated postpartum course.  She is ambulating, tolerating a regular diet, passing flatus, and urinating well. Patient is discharged home in stable condition on 05/24/2015.    Physical exam  Filed Vitals:   05/24/15 2218 05/24/15 2228 05/24/15 2243 05/24/15 2247  BP: 144/74 131/76 139/96   Pulse: 101 108 97   Temp:   96 F (35.6 C) 98.7 F (37.1 C)  TempSrc:   Tympanic Oral  Resp:      Height:      Weight:       General: alert, cooperative and no distress Lochia: appropriate Uterine Fundus: firm DVT Evaluation: No evidence of DVT seen on physical exam.  Labs: Lab Results  Component Value Date   WBC 11.0 05/23/2015   HGB 11.5*  05/23/2015   HCT 33.0* 05/23/2015   MCV 98.0 05/23/2015   PLT 246 05/23/2015   CMP Latest Ref Rng 05/23/2015  Glucose 65 - 99 mg/dL 98  BUN 6 - 20 mg/dL 7  Creatinine 4.03 - 4.74 mg/dL 2.59  Sodium 563 - 875 mmol/L 135  Potassium 3.5 - 5.1 mmol/L 3.9  Chloride 101 - 111 mmol/L 107  CO2 22 - 32 mmol/L 21(L)  Calcium 8.9 - 10.3 mg/dL 9.2  Total Protein 6.5 - 8.1 g/dL 7.1  Total Bilirubin 0.3 - 1.2 mg/dL 0.4  Alkaline Phos 38 - 126 U/L 138(H)  AST 15 - 41 U/L 29  ALT 14 - 54 U/L 19    Discharge instruction: per After Visit Summary.  Medications:    Medication List    ASK your doctor about these medications  PNV PRENATAL PLUS MULTIVITAMIN 27-1 MG Tabs  Take 1 tablet by mouth daily.        Diet: routine diet  Activity: Advance as tolerated. Pelvic rest for 6 weeks.   Outpatient follow up: No Follow-up on file.  Postpartum contraception: Progesterone only pills Rhogam Given postpartum: no Rubella vaccine given postpartum: no Varicella vaccine given postpartum: no TDaP given antepartum or postpartum: TDaP given 11/24/14 Flu vaccine given antepartum or postpartum: administer on discharge  Newborn Data: Live born female  Birth Weight: 7 lb 7 oz (3374 g) APGAR: 8, 9   Baby Feeding: Breast  Disposition:home with mother  Vena Austria, MD

## 2015-05-24 NOTE — Progress Notes (Addendum)
  Labor Progress Note   30 y.o. [id number] @ [redacted]w[redacted]d , admitted for  Pregnancy, Labor Management. Induction of labor for Polyhydramnios and history of chronic hypertension. Blood Pressures have been normal throughout pregnancy and intrapartum with the exception of 1 elevated in clinic and 1 elevated on hospital admission.  Subjective:  Pt is now comfortable with epidural  Objective:  BP 122/70 mmHg  Pulse 92  Temp(Src) 97.9 F (36.6 C) (Oral)  Resp 18  Ht  (1.727 m)  Wt 95.255 kg (210 lb)  BMI 31.94 kg/m2 Abd: mild Extr: trace to 1+ bilateral pedal edema SVE: on last exam per RN prior to epidural 4.5/70/-2  Cervidil was removed at 1100 per RN and cervical exam was no change from previous exam of 1 cm. Pt got up to shower, eat a meal and walk around. At 1330 AROM was performed by Dr Elesa Massed by fetal scalp electrode to allow a slow leak due to polyhydramnios and high station of baby. A large amount of clear fluid was produced with cervix changing from 3 to 4 cm and better application of baby's head to cervix to -2 from -3 station.  EFM: FHR: 145 bpm, variability: moderate,  accelerations:  Present,  decelerations:  Some early decelerations noted.  Toco: Frequency: Every 1-5 minutes   Assessment & Plan:  G3P1011 @ [redacted]w[redacted]d, admitted for  Pregnancy and Labor/Delivery Management  1. Pain management: epidural. 2. FWB: FHT category 1.  3. ID: GBS negative 4. Labor management: pitocin augmentation as needed All discussed with patient, see orders  Karynn Deblasi, CNM   This patient and plan were discussed with Dr Elesa Massed 05/24/2015

## 2015-05-24 NOTE — Progress Notes (Signed)
Shift report received, pt in labor with Epidural, G3P1, cervix last exam 8cm. GBS neg.

## 2015-05-24 NOTE — Progress Notes (Signed)
Pt c/o feeling nauseated, Zofran given IV at 2000, at bedside to reposition patient, states she no longer feels nauseated.

## 2015-05-24 NOTE — Anesthesia Preprocedure Evaluation (Signed)
Anesthesia Evaluation  Patient identified by MRN, date of birth, ID band Patient awake    Reviewed: Allergy & Precautions, H&P , Patient's Chart, lab work & pertinent test results  History of Anesthesia Complications Negative for: history of anesthetic complications  Airway Mallampati: II  TM Distance: >3 FB Neck ROM: full    Dental  (+) Teeth Intact   Pulmonary resolvedneg pneumonia , Current Smoker,    Pulmonary exam normal        Cardiovascular hypertension, Normal cardiovascular exam     Neuro/Psych    GI/Hepatic negative GI ROS, Neg liver ROS,   Endo/Other  negative endocrine ROS  Renal/GU negative Renal ROS     Musculoskeletal   Abdominal   Peds  Hematology negative hematology ROS (+)   Anesthesia Other Findings   Reproductive/Obstetrics (+) Pregnancy                             Anesthesia Physical Anesthesia Plan  ASA: II  Anesthesia Plan: Epidural   Post-op Pain Management:    Induction:   Airway Management Planned:   Additional Equipment:   Intra-op Plan:   Post-operative Plan:   Informed Consent: I have reviewed the patients History and Physical, chart, labs and discussed the procedure including the risks, benefits and alternatives for the proposed anesthesia with the patient or authorized representative who has indicated his/her understanding and acceptance.     Plan Discussed with: Anesthesiologist  Anesthesia Plan Comments:         Anesthesia Quick Evaluation

## 2015-05-24 NOTE — Progress Notes (Signed)
2200 - Pt complete, pt feeling slight pressure and pushing well during contractions with trial pushes, +1 station. Tresea Mall, CNM notified. Foley cath removed.  2205 - CNM at bedside for delivery, FHT remains reassuring. LD staff in room to assist. Pt positioned with legs in stirrups. Vag prep done.

## 2015-05-24 NOTE — Anesthesia Procedure Notes (Signed)
Epidural Patient location during procedure: OB Start time: 05/24/2015 2:57 PM End time: 05/24/2015 3:10 PM  Staffing Anesthesiologist: Elijio Miles F Resident/CRNA: Malva Cogan Performed by: resident/CRNA   Preanesthetic Checklist Completed: patient identified, site marked, surgical consent, pre-op evaluation, timeout performed, IV checked, risks and benefits discussed and monitors and equipment checked  Epidural Patient position: sitting Prep: Betadine Patient monitoring: continuous pulse ox and blood pressure Approach: midline Location: L3-L4 Injection technique: LOR saline  Needle:  Needle type: Tuohy  Needle gauge: 18 G Needle length: 9 cm Needle insertion depth: 6 cm Catheter type: closed end Catheter size: 20 Guage Catheter at skin depth: 11 cm Test dose: 1.5% lidocaine with Epi 1:200 K and negative  Assessment Events: blood not aspirated and injection not painful  Additional Notes Tolerated without complicationsReason for block:procedure for pain

## 2015-05-25 LAB — CBC
HCT: 31.1 % — ABNORMAL LOW (ref 35.0–47.0)
Hemoglobin: 10.6 g/dL — ABNORMAL LOW (ref 12.0–16.0)
MCH: 33.9 pg (ref 26.0–34.0)
MCHC: 34.1 g/dL (ref 32.0–36.0)
MCV: 99.2 fL (ref 80.0–100.0)
PLATELETS: 215 10*3/uL (ref 150–440)
RBC: 3.14 MIL/uL — AB (ref 3.80–5.20)
RDW: 13.3 % (ref 11.5–14.5)
WBC: 15.2 10*3/uL — AB (ref 3.6–11.0)

## 2015-05-25 LAB — RPR: RPR Ser Ql: NONREACTIVE

## 2015-05-25 MED ORDER — PRENATAL MULTIVITAMIN CH
1.0000 | ORAL_TABLET | Freq: Every day | ORAL | Status: DC
  Administered 2015-05-25: 1 via ORAL
  Filled 2015-05-25: qty 1

## 2015-05-25 MED ORDER — DIPHENHYDRAMINE HCL 25 MG PO CAPS: 25.0000 mg | ORAL_CAPSULE | Freq: Four times a day (QID) | ORAL | Status: DC | PRN

## 2015-05-25 MED ORDER — LANOLIN HYDROUS EX OINT: TOPICAL_OINTMENT | CUTANEOUS | Status: DC | PRN

## 2015-05-25 MED ORDER — WITCH HAZEL-GLYCERIN EX PADS
1.0000 "application " | MEDICATED_PAD | CUTANEOUS | Status: DC | PRN
  Administered 2015-05-25: 1 via TOPICAL
  Filled 2015-05-25: qty 100

## 2015-05-25 MED ORDER — SIMETHICONE 80 MG PO CHEW: 80.0000 mg | CHEWABLE_TABLET | ORAL | Status: DC | PRN

## 2015-05-25 MED ORDER — DIBUCAINE 1 % RE OINT
1.0000 "application " | TOPICAL_OINTMENT | RECTAL | Status: DC | PRN
  Administered 2015-05-25: 1 via RECTAL
  Filled 2015-05-25: qty 28

## 2015-05-25 MED ORDER — ACETAMINOPHEN 325 MG PO TABS
650.0000 mg | ORAL_TABLET | ORAL | Status: DC | PRN
  Administered 2015-05-25 – 2015-05-26 (×5): 650 mg via ORAL
  Filled 2015-05-25 (×5): qty 2

## 2015-05-25 MED ORDER — ONDANSETRON HCL 4 MG PO TABS: 4.0000 mg | ORAL_TABLET | ORAL | Status: DC | PRN

## 2015-05-25 MED ORDER — SENNOSIDES-DOCUSATE SODIUM 8.6-50 MG PO TABS
2.0000 | ORAL_TABLET | ORAL | Status: DC
  Administered 2015-05-25: 2 via ORAL
  Filled 2015-05-25: qty 2

## 2015-05-25 MED ORDER — ONDANSETRON HCL 4 MG/2ML IJ SOLN: 4.0000 mg | INTRAMUSCULAR | Status: DC | PRN

## 2015-05-25 NOTE — Progress Notes (Signed)
Post Partum Day 1 Subjective: voiding, tolerating PO and having some cramping.  Baby breastfeeding Objective: Blood pressure 136/86, pulse 84, temperature 98.4 F (36.9 C), temperature source Oral, resp. rate 18, height  (1.727 m), weight 95.255 kg (210 lb), SpO2 100 %, unknown if currently breastfeeding.  Physical Exam:  General: alert and no distress Lochia: appropriate Uterine Fundus: firm/ at U/ML/NT DVT Evaluation: No evidence of DVT seen on physical exam.   Recent Labs  05/23/15 2225 05/25/15 0619  HGB 11.5* 10.6*  HCT 33.0* 31.1*  WBC 11.0 15.2*  PLT 246 215    Assessment/Plan:  Stable ppd#1 Plan for discharge tomorrow  O POS/ RI/ VI/  Breast/minipill Continue pp care Consider pp check for BP and for PPD in 1 week at office   LOS: 2 days   Sheray Grist 05/25/2015, 8:48 AM

## 2015-05-25 NOTE — Anesthesia Postprocedure Evaluation (Signed)
Anesthesia Post Note  Patient: Karen Wallace  Procedure(s) Performed: * No procedures listed *  Patient location during evaluation: [facility] Anesthesia Type: Epidural Level of consciousness: awake and alert and oriented Pain management: satisfactory to patient Vital Signs Assessment: post-procedure vital signs reviewed and stable Respiratory status: respiratory function stable Cardiovascular status: stable Postop Assessment: no headache and no backache Anesthetic complications: no    Last Vitals:  Filed Vitals:   05/25/15 0150 05/25/15 0335  BP: 145/83 136/86  Pulse: 73 84  Temp: 36.8 C 36.9 C  Resp: 18     Last Pain:  Filed Vitals:   05/25/15 0335  PainSc: 0-No pain                 Clydene Pugh

## 2015-05-25 NOTE — Plan of Care (Signed)
Problem: Education: Goal: Knowledge of Childbirth will improve Outcome: Completed/Met Date Met:  05/25/15 Pt pushing well for short time despite dense Epidural and minimal urge to push, SVD @ 3546 without complications, bilateral labial abrasions, no repair necessary. EBL 200cc. Pericare performed, ice pack to perineum. Female infant doing well, stable, apgar 8/9 per transition RN.

## 2015-05-25 NOTE — Progress Notes (Signed)
Intrapartum progress note   S: patient comfortable, wondering if she is needing to be induced. +FM, no lof, occasional mild ctx, no VB  O: 122/70 97.8, 92, 20 SVE: 4/70/-2  AROM FHT: 125 mod + accels no decels TOCO: 1-3 min  A/P: 29yo GP1011 @ 39.0 with IOL for cHTN and polyhydramnios 1. IOL: after review of patient's chart, she presented to our clinic for initial prenatal care carrying the diagnosis of chronic hypertension, but has only once had an elevated pressure (SBP 140, first trimester) in our clinic throughout her pregnancy, and once per ED records (SBP 140).  She has otherwise been normotensive without medication.  Her P/C ratio has been <0.3.  It seems as though her IOL was scheduled due to simply a historical diagnosis. The polyhydramnios is not an indication for induction.  I presented this information to the patient and her husband, with the option of proceeding as planned or discharging her home to await spontaneous labor.  We agreed to check her cervix to see if there was any progression, and make a decision based on that. Cervical exam was 4/70/-2, and the decision was made to continue with IOL.  Careful and slow/controlled AROM was performed with FSE (attached then removed), for copious clear fluid.  Baby's head was then well applied to cervix.    2. IUP: category 1, continue EFM/TOCO 3. CHTN: normotensive  ----- Ranae Plumber, MD Attending Obstetrician and Gynecologist [facility]

## 2015-05-25 NOTE — Anesthesia Post-op Follow-up Note (Signed)
  Anesthesia Pain Follow-up Note  Patient: Karen Wallace  Day #: 1  Date of Follow-up: 05/25/2015 Time: 7:16 AM  Last Vitals:  Filed Vitals:   05/25/15 0150 05/25/15 0335  BP: 145/83 136/86  Pulse: 73 84  Temp: 36.8 C 36.9 C  Resp: 18     Level of Consciousness: alert  Pain: mild   Side Effects:None  Catheter Site Exam: site not evaluated  Plan: D/C from anesthesia care  Clydene Pugh

## 2015-05-26 MED ORDER — NORETHINDRONE 0.35 MG PO TABS: 1.0000 | ORAL_TABLET | Freq: Every day | ORAL | Status: DC

## 2015-05-26 MED ORDER — INFLUENZA VAC SPLIT QUAD 0.5 ML IM SUSY
0.5000 mL | PREFILLED_SYRINGE | Freq: Once | INTRAMUSCULAR | Status: AC
  Administered 2015-05-26: 0.5 mL via INTRAMUSCULAR
  Filled 2015-05-26: qty 0.5

## 2015-05-26 MED ORDER — IBUPROFEN 600 MG PO TABS: 600.0000 mg | ORAL_TABLET | Freq: Four times a day (QID) | ORAL | Status: DC | PRN

## 2015-05-26 NOTE — Progress Notes (Signed)
Discharged to home to car via auxillary 

## 2015-05-26 NOTE — Progress Notes (Signed)
Discharge instructions reviewed with patient.  Rx. Given for home meds (pain med and BCP).  Pt verb u.o of usage.

## 2015-05-28 ENCOUNTER — Ambulatory Visit
Admission: RE | Admit: 2015-05-28 | Discharge: 2015-05-28 | Disposition: A | Discharge status: Home / Self Care | Payer: Medicaid Other | Source: Ambulatory Visit | Attending: Obstetrics & Gynecology | Admitting: Obstetrics & Gynecology

## 2015-05-28 ENCOUNTER — Other Ambulatory Visit: Payer: Self-pay | Admitting: Obstetrics & Gynecology

## 2015-05-28 DIAGNOSIS — M79661 Pain in right lower leg: Secondary | ICD-10-CM

## 2015-05-28 DIAGNOSIS — M79604 Pain in right leg: Secondary | ICD-10-CM

## 2016-03-10 ENCOUNTER — Other Ambulatory Visit: Payer: Self-pay | Admitting: Internal Medicine

## 2016-03-10 DIAGNOSIS — Z30430 Encounter for insertion of intrauterine contraceptive device: Secondary | ICD-10-CM

## 2016-03-12 ENCOUNTER — Ambulatory Visit
Admission: RE | Admit: 2016-03-12 | Discharge: 2016-03-12 | Disposition: A | Discharge status: Home / Self Care | Payer: Medicaid Other | Source: Ambulatory Visit | Attending: Internal Medicine | Admitting: Internal Medicine

## 2016-03-12 DIAGNOSIS — Z30431 Encounter for routine checking of intrauterine contraceptive device: Secondary | ICD-10-CM | POA: Diagnosis not present

## 2016-03-12 DIAGNOSIS — Z30430 Encounter for insertion of intrauterine contraceptive device: Secondary | ICD-10-CM

## 2016-07-08 ENCOUNTER — Other Ambulatory Visit: Payer: Self-pay | Admitting: Internal Medicine

## 2016-07-08 DIAGNOSIS — M25561 Pain in right knee: Secondary | ICD-10-CM

## 2016-07-18 ENCOUNTER — Ambulatory Visit
Admission: RE | Admit: 2016-07-18 | Discharge: 2016-07-18 | Disposition: A | Discharge status: Home / Self Care | Payer: Commercial Managed Care - PPO | Source: Ambulatory Visit | Attending: Internal Medicine | Admitting: Internal Medicine

## 2016-07-18 ENCOUNTER — Encounter: Payer: Self-pay | Admitting: Radiology

## 2016-07-18 DIAGNOSIS — M25561 Pain in right knee: Secondary | ICD-10-CM | POA: Insufficient documentation

## 2016-08-15 ENCOUNTER — Encounter: Payer: Self-pay | Admitting: Advanced Practice Midwife

## 2016-08-15 ENCOUNTER — Ambulatory Visit (INDEPENDENT_AMBULATORY_CARE_PROVIDER_SITE_OTHER): Payer: Medicaid Other | Admitting: Advanced Practice Midwife

## 2016-08-15 VITALS — BP 130/90 | HR 107 | Ht 68.0 in | Wt 214.0 lb

## 2016-08-15 DIAGNOSIS — Z3041 Encounter for surveillance of contraceptive pills: Secondary | ICD-10-CM | POA: Diagnosis not present

## 2016-08-15 DIAGNOSIS — Z124 Encounter for screening for malignant neoplasm of cervix: Secondary | ICD-10-CM | POA: Diagnosis not present

## 2016-08-15 DIAGNOSIS — Z308 Encounter for other contraceptive management: Secondary | ICD-10-CM | POA: Diagnosis not present

## 2016-08-15 DIAGNOSIS — Z01419 Encounter for gynecological examination (general) (routine) without abnormal findings: Secondary | ICD-10-CM

## 2016-08-15 DIAGNOSIS — Z0000 Encounter for general adult medical examination without abnormal findings: Secondary | ICD-10-CM | POA: Diagnosis not present

## 2016-08-15 LAB — HM PAP SMEAR: HM Pap smear: NORMAL

## 2016-08-15 MED ORDER — NORETHINDRONE 0.35 MG PO TABS: 1.0000 | ORAL_TABLET | Freq: Every day | ORAL | 11 refills | Status: DC

## 2016-08-15 NOTE — Progress Notes (Signed)
Patient ID: Karen Wallace, female   DOB: Aug 17, 1985, 31 y.o.   MRN: [id number]     Gynecology Annual Exam  PCP: [name], MD  Chief Complaint:  Chief Complaint  Patient presents with  . Gynecologic Exam    iud removed    History of Present Illness: Patient is a 31 y.o. [id number] presents for annual exam. The patient has complaints today about her IUD. She has lower abdominal pain frequently but not every day. She remembers feeling this pain when she had an IUD before. She has had the current IUD for 1 year. She is requesting removal of the IUD. She would like to have the Nuva Ring instead but due to her status of cigarette smoking she is advised against. She does not want the Nexplanon or the shot. She agrees to the progesterone only pill.    LMP: Patient's last menstrual period was 07/22/2016.  Average Interval: regular, 28 days Duration of flow: 10 days Heavy Menses: moderate Clots: no Intermenstrual Bleeding: no Postcoital Bleeding: no Dysmenorrhea: no   The patient is sexually active. She currently uses IUD Mirena for contraception. She denies dyspareunia.  The patient does perform self breast exams.  There is no notable family history of breast or ovarian cancer in her family.  The patient wears seatbelts: yes.   The patient has regular exercise: yes.    The patient denies current symptoms of depression.    Review of Systems: Review of Systems  Constitutional: Negative.   HENT: Negative.   Eyes: Negative.   Respiratory: Negative.   Cardiovascular: Negative.   Gastrointestinal: Negative.   Genitourinary: Negative.   Musculoskeletal: Negative.   Skin: Negative.   Neurological: Negative.   Endo/Heme/Allergies: Negative.   Psychiatric/Behavioral: Negative.     Past Medical History:  Past Medical History:  Diagnosis Date  . Abnormal Pap smear of cervix    2006/2010  . Anxiety   . Depression   . Hypertension     Past Surgical History:  Past  Surgical History:  Procedure Laterality Date  . APPENDECTOMY  april 2016  . colposcopy of cervix     2007/2010  . DILATION AND CURETTAGE OF UTERUS      Gynecologic History:  Patient's last menstrual period was 07/22/2016. Contraception: IUD Last Pap: Results were: no abnormalities   Obstetric History: [id number]  Family History:  Family History  Problem Relation Age of Onset  . Hypertension Mother   . Depression Mother   . Hypertension Father     Social History:  Social History   Social History  . Marital status: Single    Spouse name: N/A  . Number of children: N/A  . Years of education: N/A   Occupational History  . Not on file.   Social History Main Topics  . Smoking status: Current Every Day Smoker    Packs/day: 0.25    Types: Cigarettes  . Smokeless tobacco: Never Used  . Alcohol use No  . Drug use: No  . Sexual activity: Yes    Birth control/ protection: Pill   Other Topics Concern  . Not on file   Social History Narrative  . No narrative on file    Allergies:  Allergies  Allergen Reactions  . Lisinopril Cough    Medications: Prior to Admission medications   Medication Sig Start Date End Date Taking? Authorizing Provider  busPIRone (BUSPAR) 5 MG tablet take 1 to 2 tablet by mouth twice a day for MOOD 08/04/16  Historical Provider, MD  ibuprofen (ADVIL,MOTRIN) 600 MG tablet Take 1 tablet (600 mg total) by mouth every 6 (six) hours as needed for moderate pain or cramping. 05/26/15   Vena Austria, MD  NIFEdipine (PROCARDIA-XL/ADALAT CC) 30 MG 24 hr tablet take 1 tablet by mouth once daily WITH 60 MG TABLETS FOR A TOTAL ...  (REFER TO PRESCRIPTION NOTES). 06/24/16   Historical Provider, MD  NIFEdipine (PROCARDIA-XL/ADALAT CC) 60 MG 24 hr tablet  06/13/16   Historical Provider, MD  norethindrone (MICRONOR,CAMILA,ERRIN) 0.35 MG tablet Take 1 tablet (0.35 mg total) by mouth daily. 08/15/16   Karen Wallace, CNM  Prenatal Vit-Fe Fumarate-FA (PNV PRENATAL PLUS  MULTIVITAMIN) 27-1 MG TABS Take 1 tablet by mouth daily. 10/06/14   Historical Provider, MD  sertraline (ZOLOFT) 50 MG tablet  06/04/16   Historical Provider, MD    Physical Exam Vitals: Blood pressure 130/90, pulse (!) 107, height  (1.727 m), weight 214 lb (97.1 kg), last menstrual period 07/22/2016, unknown if currently breastfeeding.  General: NAD HEENT: normocephalic, anicteric Thyroid: no enlargement, no palpable nodules Pulmonary: No increased work of breathing, CTAB Cardiovascular: RRR, distal pulses 2+ Breast: Breast symmetrical, no tenderness, no palpable nodules or masses, no skin or nipple retraction present, no nipple discharge.  No axillary or supraclavicular lymphadenopathy. Abdomen: NABS, soft, non-tender, non-distended.  Umbilicus without lesions.  No hepatomegaly, splenomegaly or masses palpable. No evidence of hernia  Genitourinary:  External: Normal external female genitalia.  Normal urethral meatus, normal  Bartholin's and Skene's glands.    Vagina: Normal vaginal mucosa, no evidence of prolapse.    Cervix: Grossly normal in appearance, no bleeding  Uterus: Non-enlarged, mobile, normal contour.  No CMT  Adnexa: ovaries non-enlarged, no adnexal masses  Rectal: deferred  Lymphatic: no evidence of inguinal lymphadenopathy Extremities: no edema, erythema, or tenderness Neurologic: Grossly intact Psychiatric: mood appropriate, affect full     Assessment: 31 y.o. [id number] No problem-specific Assessment & Plan notes found for this encounter.   Plan: Problem List Items Addressed This Visit    None    Visit Diagnoses    Well woman exam with routine gynecological exam    -  Primary   Relevant Orders   IGP,CtNgTv,rfx Aptima HPV ASCU   Encounter for surveillance of contraceptive pills       Relevant Medications   norethindrone (MICRONOR,CAMILA,ERRIN) 0.35 MG tablet   Cervical cancer screening       Relevant Orders   IGP,CtNgTv,rfx Aptima HPV ASCU      1) STI  screening was offered and accepted  2) ASCCP guidelines and rational discussed.  Patient opts for yearly screening interval  3) Contraception - Education given regarding options for contraception, including progesterone only products due to tobacco use. Rx for progesterone only pill today.  4) Continue healthy lifestyle diet and exercise  5) Follow up 1 year for routine annual exam   Karen Wallace, CNM           GYNECOLOGY OFFICE PROCEDURE NOTE  Karen Wallace is a 31 y.o. [id number] here for IUD removal. The patient currently has a Mirena IUD placed on March 2017. No GYN concerns.  Last pap smear was on 2017 and was normal.  IUD Removal and Reinsertion  Speculum placed in the vagina. The strings of the IUD were not visible but pt states U/S she had in recent past showed IUD in the correct place. Attempted to get strings using hooked instrument. Unable to locate. Asked Dr Bonney Aid for assistance in  removing the IUD. He was able to locate strings with the hook and a straight clamp and then grasp with the ring forceps. The IUD was successfully removed in its entirety. Patient tolerated procedure well.   Karen Wallace, CNM

## 2016-08-18 LAB — IGP,CTNGTV,RFX APTIMA HPV ASCU
Chlamydia, Nuc. Acid Amp: NEGATIVE
Gonococcus, Nuc. Acid Amp: NEGATIVE
PAP Smear Comment: 0
Trich vag by NAA: NEGATIVE

## 2016-10-22 ENCOUNTER — Encounter: Payer: Self-pay | Admitting: Emergency Medicine

## 2016-10-22 ENCOUNTER — Emergency Department: Payer: Commercial Managed Care - PPO

## 2016-10-22 DIAGNOSIS — Z793 Long term (current) use of hormonal contraceptives: Secondary | ICD-10-CM | POA: Diagnosis not present

## 2016-10-22 DIAGNOSIS — K8080 Other cholelithiasis without obstruction: Secondary | ICD-10-CM | POA: Diagnosis not present

## 2016-10-22 DIAGNOSIS — R1013 Epigastric pain: Secondary | ICD-10-CM | POA: Diagnosis present

## 2016-10-22 DIAGNOSIS — I10 Essential (primary) hypertension: Secondary | ICD-10-CM | POA: Diagnosis not present

## 2016-10-22 DIAGNOSIS — R1011 Right upper quadrant pain: Secondary | ICD-10-CM | POA: Diagnosis not present

## 2016-10-22 DIAGNOSIS — F17210 Nicotine dependence, cigarettes, uncomplicated: Secondary | ICD-10-CM | POA: Diagnosis not present

## 2016-10-22 DIAGNOSIS — Z79899 Other long term (current) drug therapy: Secondary | ICD-10-CM | POA: Insufficient documentation

## 2016-10-22 DIAGNOSIS — R110 Nausea: Secondary | ICD-10-CM | POA: Insufficient documentation

## 2016-10-22 LAB — COMPREHENSIVE METABOLIC PANEL
ALBUMIN: 4.1 g/dL (ref 3.5–5.0)
ALK PHOS: 65 U/L (ref 38–126)
ALT: 40 U/L (ref 14–54)
AST: 58 U/L — ABNORMAL HIGH (ref 15–41)
Anion gap: 6 (ref 5–15)
BUN: 16 mg/dL (ref 6–20)
CALCIUM: 9.8 mg/dL (ref 8.9–10.3)
CO2: 25 mmol/L (ref 22–32)
CREATININE: 0.51 mg/dL (ref 0.44–1.00)
Chloride: 104 mmol/L (ref 101–111)
GFR calc non Af Amer: >60 mL/min (ref >60)
GLUCOSE: 96 mg/dL (ref 65–99)
Potassium: 3.7 mmol/L (ref 3.5–5.1)
SODIUM: 135 mmol/L (ref 135–145)
Total Bilirubin: 0.6 mg/dL (ref 0.3–1.2)
Total Protein: 7.8 g/dL (ref 6.5–8.1)

## 2016-10-22 LAB — URINALYSIS, COMPLETE (UACMP) WITH MICROSCOPIC
Bacteria, UA: NONE SEEN
Bilirubin Urine: NEGATIVE
Glucose, UA: NEGATIVE mg/dL
Ketones, ur: NEGATIVE mg/dL
Leukocytes, UA: NEGATIVE
Nitrite: NEGATIVE
PH: 7 (ref 5.0–8.0)
Protein, ur: NEGATIVE mg/dL
RBC / HPF: NONE SEEN RBC/hpf (ref 0–5)
SPECIFIC GRAVITY, URINE: 1.013 (ref 1.005–1.030)

## 2016-10-22 LAB — CBC
HCT: 37.9 % (ref 35.0–47.0)
Hemoglobin: 13.1 g/dL (ref 12.0–16.0)
MCH: 32.6 pg (ref 26.0–34.0)
MCHC: 34.7 g/dL (ref 32.0–36.0)
MCV: 94.1 fL (ref 80.0–100.0)
PLATELETS: 269 10*3/uL (ref 150–440)
RBC: 4.03 MIL/uL (ref 3.80–5.20)
RDW: 12.7 % (ref 11.5–14.5)
WBC: 11.4 10*3/uL — ABNORMAL HIGH (ref 3.6–11.0)

## 2016-10-22 LAB — TROPONIN I: Troponin I: <0.03 ng/mL (ref <0.03)

## 2016-10-22 LAB — LIPASE, BLOOD: Lipase: 25 U/L (ref 11–51)

## 2016-10-22 NOTE — ED Triage Notes (Signed)
Pt ambulatory to triage with steady gait, no distress noted. Pt c/o upper epigastric pain x1 day, thought it was gas and took OTC meds with no relief. Pt denies urinary symptoms.

## 2016-10-23 ENCOUNTER — Emergency Department
Admission: EM | Admit: 2016-10-23 | Discharge: 2016-10-23 | Disposition: A | Discharge status: Home / Self Care | Payer: Commercial Managed Care - PPO | Attending: Emergency Medicine | Admitting: Emergency Medicine

## 2016-10-23 ENCOUNTER — Emergency Department: Payer: Commercial Managed Care - PPO

## 2016-10-23 DIAGNOSIS — K8080 Other cholelithiasis without obstruction: Secondary | ICD-10-CM

## 2016-10-23 DIAGNOSIS — R1013 Epigastric pain: Secondary | ICD-10-CM

## 2016-10-23 DIAGNOSIS — R110 Nausea: Secondary | ICD-10-CM

## 2016-10-23 DIAGNOSIS — R1011 Right upper quadrant pain: Secondary | ICD-10-CM

## 2016-10-23 LAB — PREGNANCY, URINE: PREG TEST UR: NEGATIVE

## 2016-10-23 MED ORDER — OXYCODONE-ACETAMINOPHEN 5-325 MG PO TABS: 1.0000 | ORAL_TABLET | ORAL | 0 refills | Status: DC | PRN

## 2016-10-23 MED ORDER — ONDANSETRON 4 MG PO TBDP: 4.0000 mg | ORAL_TABLET | Freq: Three times a day (TID) | ORAL | 0 refills | Status: DC | PRN

## 2016-10-23 MED ORDER — GI COCKTAIL ~~LOC~~
30.0000 mL | Freq: Once | ORAL | Status: AC
  Administered 2016-10-23: 30 mL via ORAL
  Filled 2016-10-23: qty 30

## 2016-10-23 NOTE — ED Notes (Signed)
Pt states that she has stomach and back pain that started today while at work.

## 2016-10-23 NOTE — ED Provider Notes (Signed)
[facility] Emergency Department Provider Note   First MD Initiated Contact with Patient 10/23/16 [id number]     (approximate)  I have reviewed the triage vital signs and the nursing notes.   HISTORY  Chief Complaint Abdominal Pain    HPI Karen Wallace is a 31 y.o. female with below list of chronic medical conditions presents to the emergency department with sharp/burning epigastric abdominal pain with a current pain score 2 out of 10. Patient states that she tried multiple medications at home including Zantac Tums without any improvement of pain. Patient states that she is expresses pain in the past and thought it might be heartburn or indigestion. Patient denies any nausea or vomiting. Patient denies any fever. Patient denies any diarrhea or constipation.   Past Medical History:  Diagnosis Date  . Abnormal Pap smear of cervix    2006/2010  . Anxiety   . Depression   . Hypertension     Patient Active Problem List   Diagnosis Date Noted  . Postpartum care following vaginal delivery 05/25/2015  . Indication for care in labor and delivery, antepartum 05/23/2015  . Preterm contractions 04/11/2015    Past Surgical History:  Procedure Laterality Date  . APPENDECTOMY  april 2016  . colposcopy of cervix     2007/2010  . DILATION AND CURETTAGE OF UTERUS      Prior to Admission medications   Medication Sig Start Date End Date Taking? Authorizing Provider  busPIRone (BUSPAR) 5 MG tablet take 1 to 2 tablet by mouth twice a day for MOOD 08/04/16   [provider]  ibuprofen (ADVIL,MOTRIN) 600 MG tablet Take 1 tablet (600 mg total) by mouth every 6 (six) hours as needed for moderate pain or cramping. 05/26/15   Vena Austria, MD  NIFEdipine (PROCARDIA-XL/ADALAT CC) 30 MG 24 hr tablet take 1 tablet by mouth once daily WITH 60 MG TABLETS FOR A TOTAL ...  (REFER TO PRESCRIPTION NOTES). 06/24/16   [provider]  NIFEdipine (PROCARDIA-XL/ADALAT  CC) 60 MG 24 hr tablet  06/13/16   [provider]  norethindrone (MICRONOR,CAMILA,ERRIN) 0.35 MG tablet Take 1 tablet (0.35 mg total) by mouth daily. 08/15/16   Tresea Mall, CNM  Prenatal Vit-Fe Fumarate-FA (PNV PRENATAL PLUS MULTIVITAMIN) 27-1 MG TABS Take 1 tablet by mouth daily. 10/06/14   [provider]  sertraline (ZOLOFT) 50 MG tablet  06/04/16   [provider]    Allergies Lisinopril  Family History  Problem Relation Age of Onset  . Hypertension Mother   . Depression Mother   . Hypertension Father     Social History Social History  Substance Use Topics  . Smoking status: Current Every Day Smoker    Packs/day: 0.25    Types: Cigarettes  . Smokeless tobacco: Never Used  . Alcohol use No    Review of Systems Constitutional: No fever/chills Eyes: No visual changes. ENT: No sore throat. Cardiovascular: Denies chest pain. Respiratory: Denies shortness of breath. Gastrointestinal: Positive for abdominal pain.  No nausea, no vomiting.  No diarrhea.  No constipation. Genitourinary: Negative for dysuria. Musculoskeletal: Negative for neck pain.  Negative for back pain. Integumentary: Negative for rash. Neurological: Negative for headaches, focal weakness or numbness.   ____________________________________________   PHYSICAL EXAM:  VITAL SIGNS: ED Triage Vitals  Enc Vitals Group     BP 10/22/16 2255 (!) 175/99     Pulse Rate 10/22/16 2255 97     Resp 10/22/16 2255 16  Temp 10/22/16 2255 98.8 F (37.1 C)     Temp Source 10/22/16 2255 Oral     SpO2 10/22/16 2255 100 %     Weight 10/22/16 2255 97.1 kg (214 lb)     Height --      Head Circumference --      Peak Flow --      Pain Score 10/23/16 0224 2     Pain Loc --      Pain Edu? --      Excl. in GC? --     Constitutional: Alert and oriented. Well appearing and in no acute distress. Eyes: Conjunctivae are normal.  Head: Atraumatic. Mouth/Throat: Mucous membranes are moist.  Oropharynx non-erythematous. Neck: No stridor.   Cardiovascular: Normal rate, regular rhythm. Good peripheral circulation. Grossly normal heart sounds. Respiratory: Normal respiratory effort.  No retractions. Lungs CTAB. Gastrointestinal: Epigastric tenderness to palpation.. No distention.  Musculoskeletal: No lower extremity tenderness nor edema. No gross deformities of extremities. Neurologic:  Normal speech and language. No gross focal neurologic deficits are appreciated.  Skin:  Skin is warm, dry and intact. No rash noted. Psychiatric: Mood and affect are normal. Speech and behavior are normal.  ____________________________________________   LABS (all labs ordered are listed, but only abnormal results are displayed)  Labs Reviewed  COMPREHENSIVE METABOLIC PANEL - Abnormal; Notable for the following:       Result Value   AST 58 (*)    All other components within normal limits  CBC - Abnormal; Notable for the following:    WBC 11.4 (*)    All other components within normal limits  URINALYSIS, COMPLETE (UACMP) WITH MICROSCOPIC - Abnormal; Notable for the following:    Color, Urine YELLOW (*)    APPearance CLOUDY (*)    Hgb urine dipstick MODERATE (*)    Squamous Epithelial / LPF 0-5 (*)    All other components within normal limits  LIPASE, BLOOD  TROPONIN I   ____________________________________________  EKG  ED ECG REPORT I, Wyocena N Ryanne Morand, the attending physician, personally viewed and interpreted this ECG.   Date: 10/23/2016  EKG Time: 10:54 PM  Rate: 89  Rhythm: Normal sinus rhythm  Axis: Normal  Intervals: Normal  ST&T Change: None  ____________________________________________  RADIOLOGY I, Langdon N Hally Colella, personally viewed and evaluated these images (plain radiographs) as part of my medical decision making, as well as reviewing the written report by the radiologist.  Dg Chest 2 View  Result Date: 10/22/2016 CLINICAL DATA:  Acute onset of shortness of  breath and upper epigastric pain. Initial encounter. EXAM: CHEST  2 VIEW COMPARISON:  None. FINDINGS: The lungs are well-aerated and clear. There is no evidence of focal opacification, pleural effusion or pneumothorax. The heart is normal in size; the mediastinal contour is within normal limits. No acute osseous abnormalities are seen. IMPRESSION: No acute cardiopulmonary process seen. Electronically Signed   By: Roanna RaiderJeffery  Chang M.D.   On: 10/22/2016 23:23    Procedures   ____________________________________________   INITIAL IMPRESSION / ASSESSMENT AND PLAN / ED COURSE  Pertinent labs & imaging results that were available during my care of the patient were reviewed by me and considered in my medical decision making (see chart for details).  31 yo female present with history of physical exam concern for possible cholelithiasis which was confirmed on ultrasound. Patient has no pain at present liver enzymes are normal including bilirubin. No physical signs of cholecystitis at this time. Spoke with patient length regarding necessity  of following up with Dr. Excell Seltzer.      ____________________________________________  FINAL CLINICAL IMPRESSION(S) / ED DIAGNOSES  Final diagnoses:  Nausea  Epigastric pain  RUQ pain  Biliary calculus of other site without obstruction     MEDICATIONS GIVEN DURING THIS VISIT:  Medications  gi cocktail (Maalox,Lidocaine,Donnatal) (30 mLs Oral Given 10/23/16 0230)     NEW OUTPATIENT MEDICATIONS STARTED DURING THIS VISIT:  New Prescriptions   No medications on file    Modified Medications   No medications on file    Discontinued Medications   No medications on file     Note:  This document was prepared using Dragon voice recognition software and may include unintentional dictation errors.    Darci Current, MD 10/23/16 [phone]

## 2016-10-23 NOTE — ED Notes (Signed)
Pt returned for work note because forgot to request when here since left so early this AM and was tired. Work note given.

## 2016-11-05 ENCOUNTER — Ambulatory Visit: Payer: Self-pay | Admitting: Surgery

## 2016-11-06 ENCOUNTER — Ambulatory Visit (INDEPENDENT_AMBULATORY_CARE_PROVIDER_SITE_OTHER): Payer: Commercial Managed Care - PPO | Admitting: Surgery

## 2016-11-06 ENCOUNTER — Encounter: Payer: Self-pay | Admitting: Surgery

## 2016-11-06 VITALS — BP 165/97 | HR 106 | Temp 99.1°F | Ht 68.0 in | Wt 218.2 lb

## 2016-11-06 DIAGNOSIS — K8020 Calculus of gallbladder without cholecystitis without obstruction: Secondary | ICD-10-CM

## 2016-11-06 NOTE — Progress Notes (Signed)
11/06/2016  Reason for Visit:  Symptomatic cholelithiasis  History of Present Illness: Karen Wallace is a 31 y.o. female who presents as a follow-up from the emergency room for symptomatically cholelithiasis. She presented to emergency room and 7/5 with abdominal pain and nausea but no vomiting. She reports that she had had a prior episode in the past which was less severe. However this episode was more severe and lasted longer. She was in the emergency room for approximately 8 hours and the pain resolved at that point. As part of the workup she had a white blood cell count was only mildly elevated to 11.4 with normal total bilirubin and a mildly elevated AST 258. Her ultrasound revealed cholelithiasis with mild gallbladder wall thickening to 5.1 mm with a trace amount of pericholecystic fluid. Her pain resolved and she was discharged to home with close follow-up.  She reports that since then she's had 1 mild flareup yesterday which included epigastric pain with some heartburn sensation. She does take occasional antacid for her reflux but does not keep up with a medication consistently.  Past Medical History: Past Medical History:  Diagnosis Date  . Abnormal Pap smear of cervix    2006/2010  . Anxiety   . Depression   . Hypertension   . Indication for care in labor and delivery, antepartum 05/23/2015  . Postpartum care following vaginal delivery 05/25/2015  . Preterm contractions 04/11/2015     Past Surgical History: Past Surgical History:  Procedure Laterality Date  . APPENDECTOMY  april 2016  . colposcopy of cervix     2007/2010  . DILATION AND CURETTAGE OF UTERUS      Home Medications: Prior to Admission medications   Medication Sig Start Date End Date Taking? Authorizing Provider  busPIRone (BUSPAR) 5 MG tablet take 1 to 2 tablet by mouth twice a day for MOOD 08/04/16  Yes [provider]  fluticasone (FLONASE) 50 MCG/ACT nasal spray instill 2 sprays into each nostril  once daily 09/17/16  Yes [provider]  NIFEdipine (PROCARDIA-XL/ADALAT CC) 30 MG 24 hr tablet take 1 tablet by mouth once daily WITH 60 MG TABLETS FOR A TOTAL ...  (REFER TO PRESCRIPTION NOTES). 06/24/16  Yes [provider]  NIFEdipine (PROCARDIA-XL/ADALAT CC) 60 MG 24 hr tablet  06/13/16  Yes [provider]    Allergies: Allergies  Allergen Reactions  . Lisinopril Cough    Social History:  reports that she has been smoking Cigarettes.  She has been smoking about 0.25 packs per day. She has never used smokeless tobacco. She reports that she does not drink alcohol or use drugs.   Family History: Family History  Problem Relation Age of Onset  . Hypertension Mother   . Depression Mother   . Hypertension Father     Review of Systems: Review of Systems  Constitutional: Negative for chills and fever.  HENT: Negative for hearing loss.   Respiratory: Negative for cough.   Cardiovascular: Negative for chest pain.  Gastrointestinal: Negative for abdominal pain, nausea and vomiting.  Genitourinary: Negative for dysuria.  Musculoskeletal: Negative for myalgias.  Skin: Negative for rash.  Neurological: Negative for dizziness.  Psychiatric/Behavioral: Negative for depression.  All other systems reviewed and are negative.   Physical Exam BP (!) 165/97 Comment: Haven't taken BP med today  Pulse (!) 106   Temp 99.1 F (37.3 C) (Oral)   Ht 5\' 8"  (1.727 m)   Wt 99 kg (218 lb 3.2 oz)   LMP 10/22/2016 (  Exact Date)   Breastfeeding? No   BMI 33.18 kg/m  CONSTITUTIONAL: No acute distress HEENT:  Normocephalic, atraumatic, extraocular motion intact. NECK: Trachea is midline, and there is no jugular venous distension.  RESPIRATORY:  Lungs are clear, and breath sounds are equal bilaterally. Normal respiratory effort without pathologic use of accessory muscles. CARDIOVASCULAR: Heart is regular without murmurs, gallops, or rubs. GI: The abdomen is soft,  nondistended, with mild tenderness in the epigastric region and right upper quadrant. Negative Murphy sign. Patient has scars from her prior laparoscopic appendectomy are well healed. MUSCULOSKELETAL:  Normal muscle strength and tone in all four extremities.  No peripheral edema or cyanosis. SKIN: Skin turgor is normal. There are no pathologic skin lesions.  NEUROLOGIC:  Motor and sensation is grossly normal.  Cranial nerves are grossly intact. PSYCH:  Alert and oriented to person, place and time. Affect is normal.  Laboratory Analysis: White blood cell count 11.4, total bilirubin 0.6, AST 58, AST 40, alkaline phosphatase 65. Creatinine 0.51  Imaging: Ultrasound showing cholelithiasis with gallbladder wall thickening and mild pericholecystic fluid.  Assessment and Plan: This is a 31 y.o. female who presents with symptomatic cholelithiasis. I have independently viewed the patient's imaging study as well as reviewed her laboratory studies. She does have cholelithiasis with gallbladder wall thickening but normal LFTs otherwise.  Discussed with the patient conservative as well as surgical management of her cholelithiasis and she is opting for surgical management with laparoscopic cholecystectomy. The risks and benefits of the procedure were discussed with the patient and she is willing to proceed. Risks of bleeding, infection, injury to surrounding structures, need for open procedure were all discussed with her. Discussed that in the meantime as well she should start taking Prilosec for her reflux but the given her symptoms and ultrasound findings is very reasonable to proceed with surgery. She will be booked for 11/19/16.  She understands this plan and all of her cultures have been answered.  Face-to-face time spent with the patient and care providers was 45 minutes, with more than 50% of the time spent counseling, educating, and coordinating care of the patient.     Liza Czerwinski Luis Joesphine Schemm,  MD [facility]   

## 2016-11-06 NOTE — Patient Instructions (Signed)
We have scheduled your surgery for 11/19/16 at [facility] with Dr.Piscoya. Please see your blue pre-care sheet for surgery information.  Please call our office if you have questions or concerns.

## 2016-11-07 ENCOUNTER — Telehealth: Payer: Self-pay | Admitting: Surgery

## 2016-11-07 NOTE — Telephone Encounter (Signed)
Pt advised of pre op date/time and sx date. Sx: 11/19/16 with Dr Dellis FilbertPiscoya--Laparoscopic cholecystectomy.  Pre op: 11/12/16 between 9-1:00pm--Phone.   Patient made aware to call [phone], between 1-3:00pm the day before surgery, to find out what time to arrive.

## 2016-11-12 ENCOUNTER — Telehealth: Payer: Self-pay | Admitting: General Practice

## 2016-11-12 ENCOUNTER — Encounter
Admission: RE | Admit: 2016-11-12 | Discharge: 2016-11-12 | Disposition: A | Discharge status: Home / Self Care | Payer: Commercial Managed Care - PPO | Source: Ambulatory Visit | Attending: Surgery | Admitting: Surgery

## 2016-11-12 HISTORY — DX: Cardiac murmur, unspecified: R01.1

## 2016-11-12 HISTORY — DX: Gastro-esophageal reflux disease without esophagitis: K21.9

## 2016-11-12 NOTE — Telephone Encounter (Signed)
Patient came by and dropped off fmla paperwork to be filled out, she is having surgery done on 11/19/16. Please let me know when paperwork is filled out so I can collect the 25.00 fee. Paperwork has been placed in your box.

## 2016-11-12 NOTE — Patient Instructions (Signed)
  Your procedure is scheduled on: 11-19-16 Report to Same Day Surgery 2nd floor medical mall [facility](Medical Mall Entrance-take elevator on left to 2nd floor.  Check in with surgery information desk.) To find out your arrival time please call [phone] between 1PM - 3PM on 11-18-16  Remember: Instructions that are not followed completely may result in serious medical risk, up to and including death, or upon the discretion of your surgeon and anesthesiologist your surgery may need to be rescheduled.    _x___ 1. Do not eat food or drink liquids after midnight. No gum chewing or hard candies.     __x__ 2. No Alcohol for 24 hours before or after surgery.   __x__3. No Smoking for 24 prior to surgery.   ____  4. Bring all medications with you on the day of surgery if instructed.    __x__ 5. Notify your doctor if there is any change in your medical condition     (cold, fever, infections).     Do not wear jewelry, make-up, hairpins, clips or nail polish.  Do not wear lotions, powders, or perfumes. You may wear deodorant.  Do not shave 48 hours prior to surgery. Men may shave face and neck.  Do not bring valuables to the hospital.    [facility] is not responsible for any belongings or valuables.               Contacts, dentures or bridgework may not be worn into surgery.  Leave your suitcase in the car. After surgery it may be brought to your room.  For patients admitted to the hospital, discharge time is determined by your treatment team.   Patients discharged the day of surgery will not be allowed to drive home.  You will need someone to drive you home and stay with you the night of your procedure.    Please read over the following fact sheets that you were given:     _x___ Take anti-hypertensive (unless it includes a diuretic), cardiac, seizure, asthma,     anti-reflux and psychiatric medicines. These include:  1. BUSPAR  2.  3.  4.  5.  6.  ____Fleets enema or Magnesium Citrate as  directed.   ____ Use CHG Soap or sage wipes as directed on instruction sheet   ____ Use inhalers on the day of surgery and bring to hospital day of surgery  ____ Stop Metformin and Janumet 2 days prior to surgery.    ____ Take 1/2 of usual insulin dose the night before surgery and none on the morning     surgery.   ____ Follow recommendations from Cardiologist, Pulmonologist or PCP regarding          stopping Aspirin, Coumadin, Pllavix ,Eliquis, Effient, or Pradaxa, and Pletal.  X____Stop Anti-inflammatories such as Advil, Aleve, IBUPROFEN, Motrin, Naproxen, Naprosyn, Goodies powders or aspirin products NOW-OK to take Tylenol    ____ Stop supplements until after surgery.     ____ Bring C-Pap to the hospital.

## 2016-11-17 NOTE — Telephone Encounter (Signed)
Patient's FMLA form was filled out and faxed to her Employer. Can you please call her and collect the $25 fee. Thank you.   It was filled out as much as possible. When patient is seen at her post-op appointment, corrections will be made.

## 2016-11-18 MED ORDER — CEFAZOLIN SODIUM-DEXTROSE 2-4 GM/100ML-% IV SOLN
2.0000 g | INTRAVENOUS | Status: AC
  Administered 2016-11-19: 2 g via INTRAVENOUS

## 2016-11-19 ENCOUNTER — Ambulatory Visit: Payer: Commercial Managed Care - PPO | Admitting: Anesthesiology

## 2016-11-19 ENCOUNTER — Encounter
Admission: RE | Disposition: A | Discharge status: Home / Self Care | Payer: Self-pay | Source: Ambulatory Visit | Attending: Surgery

## 2016-11-19 ENCOUNTER — Encounter: Payer: Self-pay | Admitting: *Deleted

## 2016-11-19 ENCOUNTER — Ambulatory Visit
Admission: RE | Admit: 2016-11-19 | Discharge: 2016-11-19 | Disposition: A | Discharge status: Home / Self Care | Payer: Commercial Managed Care - PPO | Source: Ambulatory Visit | Attending: Surgery | Admitting: Surgery

## 2016-11-19 DIAGNOSIS — F419 Anxiety disorder, unspecified: Secondary | ICD-10-CM | POA: Insufficient documentation

## 2016-11-19 DIAGNOSIS — I10 Essential (primary) hypertension: Secondary | ICD-10-CM | POA: Diagnosis not present

## 2016-11-19 DIAGNOSIS — K8010 Calculus of gallbladder with chronic cholecystitis without obstruction: Secondary | ICD-10-CM | POA: Insufficient documentation

## 2016-11-19 DIAGNOSIS — Z7951 Long term (current) use of inhaled steroids: Secondary | ICD-10-CM | POA: Diagnosis not present

## 2016-11-19 DIAGNOSIS — K8050 Calculus of bile duct without cholangitis or cholecystitis without obstruction: Secondary | ICD-10-CM

## 2016-11-19 DIAGNOSIS — Z9889 Other specified postprocedural states: Secondary | ICD-10-CM | POA: Diagnosis not present

## 2016-11-19 DIAGNOSIS — Z9049 Acquired absence of other specified parts of digestive tract: Secondary | ICD-10-CM | POA: Diagnosis not present

## 2016-11-19 DIAGNOSIS — Z818 Family history of other mental and behavioral disorders: Secondary | ICD-10-CM | POA: Insufficient documentation

## 2016-11-19 DIAGNOSIS — K8020 Calculus of gallbladder without cholecystitis without obstruction: Secondary | ICD-10-CM | POA: Diagnosis present

## 2016-11-19 DIAGNOSIS — Z8249 Family history of ischemic heart disease and other diseases of the circulatory system: Secondary | ICD-10-CM | POA: Insufficient documentation

## 2016-11-19 DIAGNOSIS — Z888 Allergy status to other drugs, medicaments and biological substances status: Secondary | ICD-10-CM | POA: Insufficient documentation

## 2016-11-19 DIAGNOSIS — F17210 Nicotine dependence, cigarettes, uncomplicated: Secondary | ICD-10-CM | POA: Diagnosis not present

## 2016-11-19 DIAGNOSIS — Z79899 Other long term (current) drug therapy: Secondary | ICD-10-CM | POA: Insufficient documentation

## 2016-11-19 DIAGNOSIS — K219 Gastro-esophageal reflux disease without esophagitis: Secondary | ICD-10-CM | POA: Diagnosis not present

## 2016-11-19 DIAGNOSIS — F329 Major depressive disorder, single episode, unspecified: Secondary | ICD-10-CM | POA: Insufficient documentation

## 2016-11-19 HISTORY — PX: CHOLECYSTECTOMY: SHX55

## 2016-11-19 LAB — POCT PREGNANCY, URINE: PREG TEST UR: NEGATIVE

## 2016-11-19 SURGERY — LAPAROSCOPIC CHOLECYSTECTOMY
Anesthesia: General

## 2016-11-19 MED ORDER — ACETAMINOPHEN 10 MG/ML IV SOLN
INTRAVENOUS | Status: AC
  Filled 2016-11-19: qty 100

## 2016-11-19 MED ORDER — SODIUM CHLORIDE 0.9 % IR SOLN
Status: DC | PRN
  Administered 2016-11-19: 40 mL

## 2016-11-19 MED ORDER — LACTATED RINGERS IV SOLN
INTRAVENOUS | Status: DC
  Administered 2016-11-19 (×2): via INTRAVENOUS

## 2016-11-19 MED ORDER — FENTANYL CITRATE (PF) 100 MCG/2ML IJ SOLN
25.0000 ug | INTRAMUSCULAR | Status: DC | PRN
  Administered 2016-11-19 (×2): 25 ug via INTRAVENOUS
  Administered 2016-11-19 (×2): 50 ug via INTRAVENOUS

## 2016-11-19 MED ORDER — ROCURONIUM BROMIDE 50 MG/5ML IV SOLN
INTRAVENOUS | Status: AC
  Filled 2016-11-19: qty 1

## 2016-11-19 MED ORDER — PROMETHAZINE HCL 25 MG/ML IJ SOLN
INTRAMUSCULAR | Status: AC
  Filled 2016-11-19: qty 1

## 2016-11-19 MED ORDER — ONDANSETRON HCL 4 MG/2ML IJ SOLN
INTRAMUSCULAR | Status: DC | PRN
  Administered 2016-11-19: 4 mg via INTRAVENOUS

## 2016-11-19 MED ORDER — ONDANSETRON HCL 4 MG/2ML IJ SOLN
INTRAMUSCULAR | Status: AC
  Filled 2016-11-19: qty 2

## 2016-11-19 MED ORDER — OXYCODONE HCL 5 MG PO TABS: 5.0000 mg | ORAL_TABLET | Freq: Four times a day (QID) | ORAL | 0 refills | Status: DC | PRN

## 2016-11-19 MED ORDER — KETOROLAC TROMETHAMINE 30 MG/ML IJ SOLN
INTRAMUSCULAR | Status: AC
  Filled 2016-11-19: qty 1

## 2016-11-19 MED ORDER — PHENYLEPHRINE HCL 10 MG/ML IJ SOLN
INTRAMUSCULAR | Status: DC | PRN
  Administered 2016-11-19 (×2): 100 ug via INTRAVENOUS

## 2016-11-19 MED ORDER — EPINEPHRINE PF 1 MG/ML IJ SOLN
INTRAMUSCULAR | Status: AC
  Filled 2016-11-19: qty 1

## 2016-11-19 MED ORDER — MEPERIDINE HCL 50 MG/ML IJ SOLN: 6.2500 mg | INTRAMUSCULAR | Status: DC | PRN

## 2016-11-19 MED ORDER — PROMETHAZINE HCL 25 MG/ML IJ SOLN
6.2500 mg | INTRAMUSCULAR | Status: DC | PRN
  Administered 2016-11-19: 12.5 mg via INTRAVENOUS

## 2016-11-19 MED ORDER — ACETAMINOPHEN 500 MG PO TABS
1000.0000 mg | ORAL_TABLET | ORAL | Status: AC
  Administered 2016-11-19: 1000 mg via ORAL

## 2016-11-19 MED ORDER — OXYCODONE HCL 5 MG/5ML PO SOLN: 5.0000 mg | Freq: Once | ORAL | Status: DC | PRN

## 2016-11-19 MED ORDER — PROPOFOL 10 MG/ML IV BOLUS
INTRAVENOUS | Status: DC | PRN
  Administered 2016-11-19: 200 mg via INTRAVENOUS

## 2016-11-19 MED ORDER — ACETAMINOPHEN 500 MG PO TABS
ORAL_TABLET | ORAL | Status: AC
  Filled 2016-11-19: qty 2

## 2016-11-19 MED ORDER — ROCURONIUM BROMIDE 100 MG/10ML IV SOLN
INTRAVENOUS | Status: DC | PRN
  Administered 2016-11-19: 50 mg via INTRAVENOUS
  Administered 2016-11-19: 30 mg via INTRAVENOUS

## 2016-11-19 MED ORDER — FENTANYL CITRATE (PF) 100 MCG/2ML IJ SOLN
INTRAMUSCULAR | Status: AC
  Filled 2016-11-19: qty 2

## 2016-11-19 MED ORDER — ACETAMINOPHEN 10 MG/ML IV SOLN
INTRAVENOUS | Status: DC | PRN
Start: 2016-11-19 — End: 2016-11-19
  Administered 2016-11-19: 1000 mg via INTRAVENOUS

## 2016-11-19 MED ORDER — GABAPENTIN 300 MG PO CAPS
300.0000 mg | ORAL_CAPSULE | ORAL | Status: AC
  Administered 2016-11-19: 300 mg via ORAL

## 2016-11-19 MED ORDER — PHENYLEPHRINE HCL 10 MG/ML IJ SOLN
INTRAMUSCULAR | Status: AC
  Filled 2016-11-19: qty 1

## 2016-11-19 MED ORDER — FAMOTIDINE 20 MG PO TABS
ORAL_TABLET | ORAL | Status: AC
  Filled 2016-11-19: qty 1

## 2016-11-19 MED ORDER — BUPIVACAINE-EPINEPHRINE (PF) 0.25% -1:200000 IJ SOLN
INTRAMUSCULAR | Status: DC | PRN
  Administered 2016-11-19: 20 mL

## 2016-11-19 MED ORDER — DEXAMETHASONE SODIUM PHOSPHATE 10 MG/ML IJ SOLN
INTRAMUSCULAR | Status: AC
  Filled 2016-11-19: qty 1

## 2016-11-19 MED ORDER — FENTANYL CITRATE (PF) 100 MCG/2ML IJ SOLN
INTRAMUSCULAR | Status: AC
  Administered 2016-11-19: 50 ug via INTRAVENOUS
  Filled 2016-11-19: qty 2

## 2016-11-19 MED ORDER — MIDAZOLAM HCL 2 MG/2ML IJ SOLN
INTRAMUSCULAR | Status: DC | PRN
Start: 2016-11-19 — End: 2016-11-19
  Administered 2016-11-19: 2 mg via INTRAVENOUS

## 2016-11-19 MED ORDER — GABAPENTIN 300 MG PO CAPS
ORAL_CAPSULE | ORAL | Status: AC
  Filled 2016-11-19: qty 1

## 2016-11-19 MED ORDER — FAMOTIDINE 20 MG PO TABS
20.0000 mg | ORAL_TABLET | Freq: Once | ORAL | Status: AC
  Administered 2016-11-19: 20 mg via ORAL

## 2016-11-19 MED ORDER — KETOROLAC TROMETHAMINE 30 MG/ML IJ SOLN
INTRAMUSCULAR | Status: DC | PRN
  Administered 2016-11-19: 30 mg via INTRAVENOUS

## 2016-11-19 MED ORDER — LIDOCAINE HCL (PF) 2 % IJ SOLN
INTRAMUSCULAR | Status: AC
  Filled 2016-11-19: qty 2

## 2016-11-19 MED ORDER — OXYCODONE HCL 5 MG PO TABS: 5.0000 mg | ORAL_TABLET | Freq: Once | ORAL | Status: DC | PRN

## 2016-11-19 MED ORDER — CHLORHEXIDINE GLUCONATE CLOTH 2 % EX PADS: 6.0000 | MEDICATED_PAD | Freq: Once | CUTANEOUS | Status: DC

## 2016-11-19 MED ORDER — FENTANYL CITRATE (PF) 100 MCG/2ML IJ SOLN
INTRAMUSCULAR | Status: AC
  Administered 2016-11-19: 25 ug via INTRAVENOUS
  Filled 2016-11-19: qty 2

## 2016-11-19 MED ORDER — PROPOFOL 10 MG/ML IV BOLUS
INTRAVENOUS | Status: AC
  Filled 2016-11-19: qty 20

## 2016-11-19 MED ORDER — CHLORHEXIDINE GLUCONATE CLOTH 2 % EX PADS
6.0000 | MEDICATED_PAD | Freq: Once | CUTANEOUS | Status: AC
  Administered 2016-11-19: 6 via TOPICAL

## 2016-11-19 MED ORDER — SUCCINYLCHOLINE CHLORIDE 20 MG/ML IJ SOLN
INTRAMUSCULAR | Status: AC
  Filled 2016-11-19: qty 1

## 2016-11-19 MED ORDER — SUGAMMADEX SODIUM 200 MG/2ML IV SOLN
INTRAVENOUS | Status: AC
  Filled 2016-11-19: qty 2

## 2016-11-19 MED ORDER — FENTANYL CITRATE (PF) 100 MCG/2ML IJ SOLN
INTRAMUSCULAR | Status: DC | PRN
  Administered 2016-11-19 (×2): 50 ug via INTRAVENOUS

## 2016-11-19 MED ORDER — LIDOCAINE HCL (CARDIAC) 20 MG/ML IV SOLN
INTRAVENOUS | Status: DC | PRN
  Administered 2016-11-19: 100 mg via INTRAVENOUS

## 2016-11-19 MED ORDER — MIDAZOLAM HCL 2 MG/2ML IJ SOLN
INTRAMUSCULAR | Status: AC
  Filled 2016-11-19: qty 2

## 2016-11-19 MED ORDER — CEFAZOLIN SODIUM-DEXTROSE 2-4 GM/100ML-% IV SOLN
INTRAVENOUS | Status: AC
  Filled 2016-11-19: qty 100

## 2016-11-19 MED ORDER — DEXAMETHASONE SODIUM PHOSPHATE 10 MG/ML IJ SOLN
INTRAMUSCULAR | Status: DC | PRN
  Administered 2016-11-19: 10 mg via INTRAVENOUS

## 2016-11-19 MED ORDER — LACTATED RINGERS IV SOLN
INTRAVENOUS | Status: DC | PRN
Start: 2016-11-19 — End: 2016-11-19
  Administered 2016-11-19: 09:00:00 via INTRAVENOUS

## 2016-11-19 MED ORDER — SUGAMMADEX SODIUM 200 MG/2ML IV SOLN
INTRAVENOUS | Status: DC | PRN
  Administered 2016-11-19: 200 mg via INTRAVENOUS

## 2016-11-19 MED ORDER — BUPIVACAINE HCL (PF) 0.25 % IJ SOLN
INTRAMUSCULAR | Status: AC
  Filled 2016-11-19: qty 30

## 2016-11-19 SURGICAL SUPPLY — 44 items
APPLIER CLIP 5 13 M/L LIGAMAX5 (MISCELLANEOUS) ×3
BLADE SURG 15 STRL LF DISP TIS (BLADE) ×1 IMPLANT
BLADE SURG 15 STRL SS (BLADE) ×2
CANISTER SUCT 1200ML W/VALVE (MISCELLANEOUS) ×3 IMPLANT
CATH CHOLANGI 4FR 420404F (CATHETERS) IMPLANT
CHLORAPREP W/TINT 26ML (MISCELLANEOUS) ×3 IMPLANT
CLIP APPLIE 5 13 M/L LIGAMAX5 (MISCELLANEOUS) ×1 IMPLANT
CONRAY 60ML FOR OR (MISCELLANEOUS) IMPLANT
DERMABOND ADVANCED (GAUZE/BANDAGES/DRESSINGS) ×2
DERMABOND ADVANCED .7 DNX12 (GAUZE/BANDAGES/DRESSINGS) ×1 IMPLANT
DRAPE C-ARM XRAY 36X54 (DRAPES) IMPLANT
ELECT REM PT RETURN 9FT ADLT (ELECTROSURGICAL) ×3
ELECTRODE REM PT RTRN 9FT ADLT (ELECTROSURGICAL) ×1 IMPLANT
FILTER LAP SMOKE EVAC STRL (MISCELLANEOUS) IMPLANT
GLOVE SURG SYN 7.0 (GLOVE) ×6 IMPLANT
GLOVE SURG SYN 7.5  E (GLOVE) ×4
GLOVE SURG SYN 7.5 E (GLOVE) ×2 IMPLANT
GOWN STRL REUS W/ TWL LRG LVL3 (GOWN DISPOSABLE) ×2 IMPLANT
GOWN STRL REUS W/TWL LRG LVL3 (GOWN DISPOSABLE) ×4
IRRIGATION STRYKERFLOW (MISCELLANEOUS) IMPLANT
IRRIGATOR STRYKERFLOW (MISCELLANEOUS)
IV CATH ANGIO 12GX3 LT BLUE (NEEDLE) IMPLANT
IV NS 1000ML (IV SOLUTION)
IV NS 1000ML BAXH (IV SOLUTION) IMPLANT
JACKSON PRATT 10 (INSTRUMENTS) IMPLANT
L-HOOK LAP DISP 36CM (ELECTROSURGICAL) ×3
LABEL OR SOLS (LABEL) IMPLANT
LHOOK LAP DISP 36CM (ELECTROSURGICAL) ×1 IMPLANT
NDL SAFETY 22GX1.5 (NEEDLE) IMPLANT
NEEDLE HYPO 22GX1.5 SAFETY (NEEDLE) ×3 IMPLANT
NS IRRIG 500ML POUR BTL (IV SOLUTION) ×3 IMPLANT
PACK LAP CHOLECYSTECTOMY (MISCELLANEOUS) ×3 IMPLANT
PENCIL ELECTRO HAND CTR (MISCELLANEOUS) ×3 IMPLANT
POUCH SPECIMEN RETRIEVAL 10MM (ENDOMECHANICALS) ×3 IMPLANT
SCISSORS METZENBAUM CVD 33 (INSTRUMENTS) ×3 IMPLANT
SLEEVE ADV FIXATION 5X100MM (TROCAR) ×6 IMPLANT
SPONGE VERSALON 4X4 4PLY (MISCELLANEOUS) IMPLANT
SUT MNCRL 4-0 (SUTURE) ×2
SUT MNCRL 4-0 27XMFL (SUTURE) ×1
SUT VICRYL 0 AB UR-6 (SUTURE) ×3 IMPLANT
SUTURE MNCRL 4-0 27XMF (SUTURE) ×1 IMPLANT
TROCAR 130MM GELPORT  DAV (MISCELLANEOUS) ×3 IMPLANT
TROCAR Z-THREAD OPTICAL 5X100M (TROCAR) ×3 IMPLANT
TUBING INSUFFLATOR HI FLOW (MISCELLANEOUS) ×3 IMPLANT

## 2016-11-19 NOTE — Anesthesia Postprocedure Evaluation (Signed)
Anesthesia Post Note  Patient: Karen Wallace  Procedure(s) Performed: Procedure(s) (LRB): LAPAROSCOPIC CHOLECYSTECTOMY (N/A)  Patient location during evaluation: PACU Anesthesia Type: General Level of consciousness: awake and alert and oriented Pain management: pain level controlled Vital Signs Assessment: post-procedure vital signs reviewed and stable Respiratory status: spontaneous breathing, nonlabored ventilation and respiratory function stable Cardiovascular status: blood pressure returned to baseline and stable Postop Assessment: no signs of nausea or vomiting Anesthetic complications: no     Last Vitals:  Vitals:   11/19/16 1105 11/19/16 1120  BP: 110/79 104/73  Pulse: 87 84  Resp: (!) 24 20  Temp:      Last Pain:  Vitals:   11/19/16 1120  TempSrc:   PainSc: 5                  Karen Wallace

## 2016-11-19 NOTE — Anesthesia Preprocedure Evaluation (Signed)
Anesthesia Evaluation  Patient identified by MRN, date of birth, ID band Patient awake    Reviewed: Allergy & Precautions, NPO status , Patient's Chart, lab work & pertinent test results  History of Anesthesia Complications Negative for: history of anesthetic complications  Airway Mallampati: III  TM Distance: >3 FB Neck ROM: Full    Dental no notable dental hx.    Pulmonary neg sleep apnea, neg COPD, Current Smoker,    breath sounds clear to auscultation- rhonchi (-) wheezing      Cardiovascular hypertension, Pt. on medications (-) CAD, (-) Past MI and (-) Cardiac Stents  Rhythm:Regular Rate:Normal - Systolic murmurs and - Diastolic murmurs    Neuro/Psych PSYCHIATRIC DISORDERS Anxiety Depression negative neurological ROS     GI/Hepatic Neg liver ROS, GERD  ,  Endo/Other  negative endocrine ROSneg diabetes  Renal/GU negative Renal ROS     Musculoskeletal negative musculoskeletal ROS (+)   Abdominal (+) + obese,   Peds  Hematology negative hematology ROS (+)   Anesthesia Other Findings Past Medical History: No date: Abnormal Pap smear of cervix     Comment:  2006/2010 No date: Anxiety No date: Depression No date: GERD (gastroesophageal reflux disease)     Comment:  OTC GUMMIES PRN No date: Heart murmur     Comment:  AS A  CHILD No date: Hypertension 05/23/2015: Indication for care in labor and delivery, antepartum 05/25/2015: Postpartum care following vaginal delivery 04/11/2015: Preterm contractions   Reproductive/Obstetrics                             Anesthesia Physical Anesthesia Plan  ASA: II  Anesthesia Plan: General   Post-op Pain Management:    Induction: Intravenous  PONV Risk Score and Plan: 1 and Ondansetron and Dexamethasone  Airway Management Planned: Oral ETT  Additional Equipment:   Intra-op Plan:   Post-operative Plan: Extubation in OR  Informed  Consent: I have reviewed the patients History and Physical, chart, labs and discussed the procedure including the risks, benefits and alternatives for the proposed anesthesia with the patient or authorized representative who has indicated his/her understanding and acceptance.   Dental advisory given  Plan Discussed with: CRNA and Anesthesiologist  Anesthesia Plan Comments:         Anesthesia Quick Evaluation

## 2016-11-19 NOTE — Discharge Instructions (Signed)
  AMBULATORY SURGERY  DISCHARGE INSTRUCTIONS   1) The drugs that you were given will stay in your system until tomorrow so for the next 24 hours you should not:  A) Drive an automobile B) Make any legal decisions C) Drink any alcoholic beverage   2) You may resume regular meals tomorrow.  Today it is better to start with liquids and gradually work up to solid foods.  You may eat anything you prefer, but it is better to start with liquids, then soup and crackers, and gradually work up to solid foods.   3) Please notify your doctor immediately if you have any unusual bleeding, trouble breathing, redness and pain at the surgery site, drainage, fever, or pain not relieved by medication.    4) Additional Instructions: TAKE A STOOL SOFTENER TWICE A DAY WHILE TAKING NARCOTIC PAIN MEDICINE TO PREVENT CONSTIPATION   Please contact your physician with any problems or Same Day Surgery at [phone], Monday through Friday 6 am to 4 pm, or [place] [facility] number at [phone].   

## 2016-11-19 NOTE — Anesthesia Post-op Follow-up Note (Cosign Needed)
Anesthesia QCDR form completed.        

## 2016-11-19 NOTE — Interval H&P Note (Signed)
History and Physical Interval Note:  11/19/2016 8:42 AM  Karen AbbottSherita D Wallace  has presented today for surgery, with the diagnosis of biliary colic, SYSTEMATIC CHOLELITHIASIS  The various methods of treatment have been discussed with the patient and family. After consideration of risks, benefits and other options for treatment, the patient has consented to  Procedure(s): LAPAROSCOPIC CHOLECYSTECTOMY (N/A) as a surgical intervention .  The patient's history has been reviewed, patient examined, no change in status, stable for surgery.  I have reviewed the patient's chart and labs.  Questions were answered to the patient's satisfaction.     Naraya Stoneberg

## 2016-11-19 NOTE — Anesthesia Procedure Notes (Signed)
Procedure Name: Intubation Date/Time: 11/19/2016 9:10 AM Performed by: Silvana Newness Pre-anesthesia Checklist: Patient identified, Emergency Drugs available, Suction available, Patient being monitored and Timeout performed Patient Re-evaluated:Patient Re-evaluated prior to induction Oxygen Delivery Method: Circle system utilized Preoxygenation: Pre-oxygenation with 100% oxygen Induction Type: IV induction Ventilation: Mask ventilation without difficulty Laryngoscope Size: Mac and 3 Grade View: Grade I Tube type: Oral Tube size: 7.0 mm Number of attempts: 1 Airway Equipment and Method: Stylet Placement Confirmation: ETT inserted through vocal cords under direct vision,  positive ETCO2 and breath sounds checked- equal and bilateral Secured at: 21 cm Tube secured with: Tape Dental Injury: Teeth and Oropharynx as per pre-operative assessment

## 2016-11-19 NOTE — Op Note (Signed)
  Procedure Date:  11/19/2016  Pre-operative Diagnosis:  Biliary Colic  Post-operative Diagnosis: Biliary Colic  Procedure:  Laparoscopic cholecystectomy  Surgeon:  Howie IllJose Luis Symir Mah, MD  Anesthesia:  General endotracheal  Estimated Blood Loss:  5 ml  Specimens:  gallbladder  Complications:  None  Indications for Procedure:  This is a 31 y.o. female who presents with abdominal pain and workup revealing biliary colic.  The benefits, complications, treatment options, and expected outcomes were discussed with the patient. The risks of bleeding, infection, recurrence of symptoms, failure to resolve symptoms, bile duct damage, bile duct leak, retained common bile duct stone, bowel injury, and need for further procedures were all discussed with the patient and she was willing to proceed.  Description of Procedure: The patient was correctly identified in the preoperative area and brought into the operating room.  The patient was placed supine with VTE prophylaxis in place.  Appropriate time-outs were performed.  Anesthesia was induced and the patient was intubated.  Appropriate antibiotics were infused.  The abdomen was prepped and draped in a sterile fashion. An infraumbilical incision was made. A cutdown technique was used to enter the abdominal cavity without injury, and a Hasson trocar was inserted.  Pneumoperitoneum was obtained with appropriate opening pressures.  A 5-mm port was placed in the subxiphoid area and two 5-mm ports were placed in the right upper quadrant under direct visualization.  The gallbladder was identified.  The fundus was grasped and retracted cephalad.  Adhesions were lysed bluntly and with electrocautery. The infundibulum was grasped and retracted laterally, exposing the peritoneum overlying the gallbladder.  This was incised with electrocautery and extended on either side of the gallbladder.  The cystic duct and cystic artery were clearly identified and bluntly  dissected.  Both were clipped twice proximally and once distally, cutting in between.  The gallbladder was taken from the gallbladder fossa in a retrograde fashion with electrocautery. The gallbladder was placed in an Endocatch bag and brought out via the umbilical incision. The liver bed was inspected and any bleeding was controlled with electrocautery. The right upper quadrant was then inspected again revealing intact clips, no bleeding, and no ductal injury.  The 5 mm ports were removed under direct visualization and the Hasson trocar was removed.  The fascial opening was closed using 0 vicryl suture.  Local anesthetic was infused in all incisions and the incisions were closed with 4-0 Monocryl.  The wounds were cleaned and sealed with DermaBond.  The patient was emerged from anesthesia and extubated and brought to the recovery room for further management.  The patient tolerated the procedure well and all counts were correct at the end of the case.   Howie IllJose Luis Syndi Pua, MD

## 2016-11-19 NOTE — Transfer of Care (Signed)
Immediate Anesthesia Transfer of Care Note  Patient: Karen Wallace  Procedure(s) Performed: Procedure(s): LAPAROSCOPIC CHOLECYSTECTOMY (N/A)  Patient Location: PACU  Anesthesia Type:General  Level of Consciousness: drowsy and patient cooperative  Airway & Oxygen Therapy: Patient Spontanous Breathing and Patient connected to face mask oxygen  Post-op Assessment: Report given to RN, Post -op Vital signs reviewed and stable and Patient moving all extremities X 4  Post vital signs: Reviewed and stable  Last Vitals:  Vitals:   11/19/16 0803 11/19/16 1035  BP: 132/89 (P) 109/84  Pulse: (!) 103   Resp: 16   Temp: 36.5 C (!) (P) 36.3 C    Last Pain:  Vitals:   11/19/16 0803  TempSrc: Tympanic  PainSc: 6       Patients Stated Pain Goal: 2 (11/19/16 0803)  Complications: No apparent anesthesia complications

## 2016-11-19 NOTE — H&P (View-Only) (Signed)
11/06/2016  Reason for Visit:  Symptomatic cholelithiasis  History of Present Illness: Karen Wallace is a 31 y.o. female who presents as a follow-up from the emergency room for symptomatically cholelithiasis. She presented to emergency room and 7/5 with abdominal pain and nausea but no vomiting. She reports that she had had a prior episode in the past which was less severe. However this episode was more severe and lasted longer. She was in the emergency room for approximately 8 hours and the pain resolved at that point. As part of the workup she had a white blood cell count was only mildly elevated to 11.4 with normal total bilirubin and a mildly elevated AST 258. Her ultrasound revealed cholelithiasis with mild gallbladder wall thickening to 5.1 mm with a trace amount of pericholecystic fluid. Her pain resolved and she was discharged to home with close follow-up.  She reports that since then she's had 1 mild flareup yesterday which included epigastric pain with some heartburn sensation. She does take occasional antacid for her reflux but does not keep up with a medication consistently.  Past Medical History: Past Medical History:  Diagnosis Date  . Abnormal Pap smear of cervix    2006/2010  . Anxiety   . Depression   . Hypertension   . Indication for care in labor and delivery, antepartum 05/23/2015  . Postpartum care following vaginal delivery 05/25/2015  . Preterm contractions 04/11/2015     Past Surgical History: Past Surgical History:  Procedure Laterality Date  . APPENDECTOMY  april 2016  . colposcopy of cervix     2007/2010  . DILATION AND CURETTAGE OF UTERUS      Home Medications: Prior to Admission medications   Medication Sig Start Date End Date Taking? Authorizing Provider  busPIRone (BUSPAR) 5 MG tablet take 1 to 2 tablet by mouth twice a day for MOOD 08/04/16  Yes [provider]  fluticasone (FLONASE) 50 MCG/ACT nasal spray instill 2 sprays into each nostril  once daily 09/17/16  Yes [provider]  NIFEdipine (PROCARDIA-XL/ADALAT CC) 30 MG 24 hr tablet take 1 tablet by mouth once daily WITH 60 MG TABLETS FOR A TOTAL ...  (REFER TO PRESCRIPTION NOTES). 06/24/16  Yes [provider]  NIFEdipine (PROCARDIA-XL/ADALAT CC) 60 MG 24 hr tablet  06/13/16  Yes [provider]    Allergies: Allergies  Allergen Reactions  . Lisinopril Cough    Social History:  reports that she has been smoking Cigarettes.  She has been smoking about 0.25 packs per day. She has never used smokeless tobacco. She reports that she does not drink alcohol or use drugs.   Family History: Family History  Problem Relation Age of Onset  . Hypertension Mother   . Depression Mother   . Hypertension Father     Review of Systems: Review of Systems  Constitutional: Negative for chills and fever.  HENT: Negative for hearing loss.   Respiratory: Negative for cough.   Cardiovascular: Negative for chest pain.  Gastrointestinal: Negative for abdominal pain, nausea and vomiting.  Genitourinary: Negative for dysuria.  Musculoskeletal: Negative for myalgias.  Skin: Negative for rash.  Neurological: Negative for dizziness.  Psychiatric/Behavioral: Negative for depression.  All other systems reviewed and are negative.   Physical Exam BP (!) 165/97 Comment: Haven't taken BP med today  Pulse (!) 106   Temp 99.1 F (37.3 C) (Oral)   Ht 5\' 8"  (1.727 m)   Wt 99 kg (218 lb 3.2 oz)   LMP 10/22/2016 (  Exact Date)   Breastfeeding? No   BMI 33.18 kg/m  CONSTITUTIONAL: No acute distress HEENT:  Normocephalic, atraumatic, extraocular motion intact. NECK: Trachea is midline, and there is no jugular venous distension.  RESPIRATORY:  Lungs are clear, and breath sounds are equal bilaterally. Normal respiratory effort without pathologic use of accessory muscles. CARDIOVASCULAR: Heart is regular without murmurs, gallops, or rubs. GI: The abdomen is soft,  nondistended, with mild tenderness in the epigastric region and right upper quadrant. Negative Murphy sign. Patient has scars from her prior laparoscopic appendectomy are well healed. MUSCULOSKELETAL:  Normal muscle strength and tone in all four extremities.  No peripheral edema or cyanosis. SKIN: Skin turgor is normal. There are no pathologic skin lesions.  NEUROLOGIC:  Motor and sensation is grossly normal.  Cranial nerves are grossly intact. PSYCH:  Alert and oriented to person, place and time. Affect is normal.  Laboratory Analysis: White blood cell count 11.4, total bilirubin 0.6, AST 58, AST 40, alkaline phosphatase 65. Creatinine 0.51  Imaging: Ultrasound showing cholelithiasis with gallbladder wall thickening and mild pericholecystic fluid.  Assessment and Plan: This is a 31 y.o. female who presents with symptomatic cholelithiasis. I have independently viewed the patient's imaging study as well as reviewed her laboratory studies. She does have cholelithiasis with gallbladder wall thickening but normal LFTs otherwise.  Discussed with the patient conservative as well as surgical management of her cholelithiasis and she is opting for surgical management with laparoscopic cholecystectomy. The risks and benefits of the procedure were discussed with the patient and she is willing to proceed. Risks of bleeding, infection, injury to surrounding structures, need for open procedure were all discussed with her. Discussed that in the meantime as well she should start taking Prilosec for her reflux but the given her symptoms and ultrasound findings is very reasonable to proceed with surgery. She will be booked for 11/19/16.  She understands this plan and all of her cultures have been answered.  Face-to-face time spent with the patient and care providers was 45 minutes, with more than 50% of the time spent counseling, educating, and coordinating care of the patient.     Howie IllJose Luis Kalai Baca,  MD [facility]

## 2016-11-20 LAB — SURGICAL PATHOLOGY

## 2016-11-27 NOTE — Telephone Encounter (Signed)
Patient came by and paid her 25.00 fee I have placed her disability paperwork in your folder up front.

## 2016-12-03 ENCOUNTER — Ambulatory Visit (INDEPENDENT_AMBULATORY_CARE_PROVIDER_SITE_OTHER): Payer: Commercial Managed Care - PPO | Admitting: Surgery

## 2016-12-03 ENCOUNTER — Telehealth: Payer: Self-pay

## 2016-12-03 ENCOUNTER — Encounter: Payer: Self-pay | Admitting: Surgery

## 2016-12-03 VITALS — BP 151/98 | HR 122 | Temp 98.9°F | Ht 65.0 in | Wt 213.2 lb

## 2016-12-03 DIAGNOSIS — K8020 Calculus of gallbladder without cholecystitis without obstruction: Secondary | ICD-10-CM

## 2016-12-03 NOTE — Telephone Encounter (Signed)
Patient's disability form was filled out and faxed.   Tried to notify patient, however, patient's phone number was disconnected.

## 2016-12-03 NOTE — Progress Notes (Signed)
Outpatient postop visit  12/03/2016  Karen Wallace is an 31 y.o. female.    Procedure: Laparoscopic cholecystectomy  CC: Minimal and occasional pain  HPI: Patient feels well her appetite is improved. She has no fevers or chills and wants to go back to work. She does do some mild heavy lifting at work.  Medications reviewed.    Physical Exam:  LMP 11/10/2016 (Exact Date)     PE: No icterus no jaundice abdomen is soft nontender wounds are clean.    Assessment/Plan:  Status post laparoscopic cholecystectomy. Path was reviewed and no sign of malignancy. Patient doing very well she can go back to work but she does do some mild heavy lifting and recommended that she wait a total 4 weeks from the time of surgery as she has a light duty job.  Lattie Hawichard E Parks Czajkowski, MD, FACS

## 2016-12-03 NOTE — Patient Instructions (Signed)
Please call our office if you have any questions or concerns.  

## 2016-12-05 ENCOUNTER — Telehealth: Payer: Self-pay

## 2016-12-05 NOTE — Telephone Encounter (Signed)
Patient called waiting to know if she could get a work note to go back to work on Monday with lifting restrictions. I told her that I would have that typed up for her and left at the front desk for her to pick up Monday morning. Patient verbalized understanding at this time.

## 2016-12-10 ENCOUNTER — Telehealth: Payer: Self-pay

## 2016-12-10 NOTE — Telephone Encounter (Signed)
Patient called in at this time. She is requesting a change in her return to work note. She will return to work on 12/18/16 with no restrictions.  Also she would like her completed disability paperwork faxed to [facility] [phone] Also write patients ID number [id number]. Patient can be reached at [phone].

## 2016-12-23 ENCOUNTER — Telehealth: Payer: Self-pay

## 2016-12-23 NOTE — Telephone Encounter (Signed)
Patient's disability paperwork has been faxed and copy placed in Disability folder.

## 2017-01-05 ENCOUNTER — Telehealth: Payer: Self-pay | Admitting: General Practice

## 2017-01-05 NOTE — Telephone Encounter (Signed)
Called Karen Wallace and had to leave her the detailed message with the information that she was requesting.

## 2017-01-05 NOTE — Telephone Encounter (Signed)
Darlene called and left a message on Friday at 120pm from short term disability regarding the patient, said they had the patient to return to work on 12/18/16, but the forms were not filled out, they were calling they need to know patients restrictions, and limitations said to ask for Oliver Barre at [phone] ext. [phone]. Please call and advice.

## 2017-09-16 ENCOUNTER — Ambulatory Visit: Payer: Commercial Managed Care - PPO | Attending: Neurology

## 2017-09-16 DIAGNOSIS — F5101 Primary insomnia: Secondary | ICD-10-CM | POA: Insufficient documentation

## 2017-09-16 DIAGNOSIS — R0683 Snoring: Secondary | ICD-10-CM | POA: Insufficient documentation

## 2017-09-19 DIAGNOSIS — A6000 Herpesviral infection of urogenital system, unspecified: Secondary | ICD-10-CM | POA: Insufficient documentation

## 2017-09-19 HISTORY — DX: Herpesviral infection of urogenital system, unspecified: A60.00

## 2017-09-22 ENCOUNTER — Telehealth: Payer: Self-pay | Admitting: Advanced Practice Midwife

## 2017-09-23 NOTE — Telephone Encounter (Signed)
Called and spoke with patient to schedule annual. Pt reports already had been seen by [facility]. So we schedule medication follow up

## 2017-09-23 NOTE — Telephone Encounter (Signed)
Per JEG patient [facility] on birthcontrol since patient has already been seen by them for her annual. I left detailed message for patient to know.

## 2017-09-23 NOTE — Telephone Encounter (Signed)
Patient needs to schedule annual exam  

## 2017-09-23 NOTE — Telephone Encounter (Signed)
Thanks for following up with her about getting her birth control [facility]

## 2017-09-28 ENCOUNTER — Ambulatory Visit (INDEPENDENT_AMBULATORY_CARE_PROVIDER_SITE_OTHER): Payer: Commercial Managed Care - PPO | Admitting: Obstetrics and Gynecology

## 2017-09-28 ENCOUNTER — Encounter: Payer: Self-pay | Admitting: Obstetrics and Gynecology

## 2017-09-28 VITALS — BP 142/96 | HR 94 | Ht 68.0 in | Wt 219.5 lb

## 2017-09-28 DIAGNOSIS — N898 Other specified noninflammatory disorders of vagina: Secondary | ICD-10-CM | POA: Diagnosis not present

## 2017-09-28 NOTE — Progress Notes (Signed)
[name], MD   Chief Complaint  Patient presents with  . Vaginal Pain    Lump on right side of vagina x 1 week off and on irritation     HPI:      Ms. Karen Wallace is a 32 y.o. [id number] who LMP was Patient's last menstrual period was 09/08/2017 (exact date)., presents today for red, swollen area vaginally for the past wk. Area is tender and has drained. Pt shaves but hasn't with recent sx. Felt feverish a couple days ago without taking her temp. No increased vag d/c, odor. Is sex active, no new partners. Uses olay soap; no soap/detergent changes. No hx of HSV.   Past Medical History:  Diagnosis Date  . Abnormal Pap smear of cervix    2006/2010  . Anxiety   . Depression   . GERD (gastroesophageal reflux disease)    OTC GUMMIES PRN  . Heart murmur    AS A  CHILD  . Hypertension   . Indication for care in labor and delivery, antepartum 05/23/2015  . Postpartum care following vaginal delivery 05/25/2015  . Preterm contractions 04/11/2015    Past Surgical History:  Procedure Laterality Date  . APPENDECTOMY  april 2016  . CHOLECYSTECTOMY N/A 11/19/2016   Procedure: LAPAROSCOPIC CHOLECYSTECTOMY;  Surgeon: Henrene Dodge, MD;  Location: [facility] ORS;  Service: General;  Laterality: N/A;  . colposcopy of cervix     2007/2010  . DILATION AND CURETTAGE OF UTERUS      Family History  Problem Relation Age of Onset  . Hypertension Mother   . Depression Mother   . Hypertension Father     Social History   Socioeconomic History  . Marital status: Single    Spouse name: Not on file  . Number of children: Not on file  . Years of education: Not on file  . Highest education level: Not on file  Occupational History  . Not on file  Social Needs  . Financial resource strain: Not on file  . Food insecurity:    Worry: Not on file    Inability: Not on file  . Transportation needs:    Medical: Not on file    Non-medical: Not on file  Tobacco Use  . Smoking status: Current  Every Day Smoker    Packs/day: 0.25    Years: 9.00    Pack years: 2.25    Types: Cigarettes  . Smokeless tobacco: Never Used  Substance and Sexual Activity  . Alcohol use: Yes    Comment: OCC  . Drug use: No  . Sexual activity: Yes    Birth control/protection: Pill  Lifestyle  . Physical activity:    Days per week: Not on file    Minutes per session: Not on file  . Stress: Not on file  Relationships  . Social connections:    Talks on phone: Not on file    Gets together: Not on file    Attends religious service: Not on file    Active member of club or organization: Not on file    Attends meetings of clubs or organizations: Not on file    Relationship status: Not on file  . Intimate partner violence:    Fear of current or ex partner: Not on file    Emotionally abused: Not on file    Physically abused: Not on file    Forced sexual activity: Not on file  Other Topics Concern  . Not on file  Social History Narrative  . Not on file    Outpatient Medications Prior to Visit  Medication Sig Dispense Refill  . busPIRone (BUSPAR) 5 MG tablet take 1 to 2 tablet by mouth twice a day for MOOD-PRN  0  . fluticasone (FLONASE) 50 MCG/ACT nasal spray instill 2 sprays into each nostril once daily as needed for allergies  0  . NIFEdipine (PROCARDIA-XL/ADALAT CC) 30 MG 24 hr tablet take 1 tablet by mouth once daily WITH 60 MG TABLETS FOR A TOTAL 90...  (REFER TO PRESCRIPTION NOTES).-BEDTIME  0  . NIFEdipine (PROCARDIA-XL/ADALAT CC) 60 MG 24 hr tablet Take 60 mg by mouth See admin instructions. Takes a 60 mg and 30 mg to equal 90 mg @ BEDTIME  0  . busPIRone (BUSPAR) 10 MG tablet TK 1/2 TO 1 T PO BID FOR MOOD  0  . ibuprofen (ADVIL,MOTRIN) 200 MG tablet Take 400 mg by mouth every 8 (eight) hours as needed for mild pain.    Marland Kitchen. norethindrone (MICRONOR,CAMILA,ERRIN) 0.35 MG tablet TAKE 1 TABLET BY MOUTH ONCE DAILY (Patient not taking: Reported on 09/28/2017) 28 tablet 0   No facility-administered  medications prior to visit.     ROS:  Review of Systems  Constitutional: Positive for fatigue and fever.  Gastrointestinal: Negative for blood in stool, constipation, diarrhea, nausea and vomiting.  Genitourinary: Positive for genital sores. Negative for dyspareunia, dysuria, flank pain, frequency, hematuria, urgency, vaginal bleeding, vaginal discharge and vaginal pain.  Musculoskeletal: Negative for back pain.  Skin: Negative for rash.   BREAST: No symptoms   OBJECTIVE:   Vitals:  BP (!) 142/96   Pulse 94   Ht 5\' 8"  (1.727 m)   Wt 219 lb 8 oz (99.6 kg)   LMP 09/08/2017 (Exact Date)   BMI 33.37 kg/m   Physical Exam  Constitutional: She is oriented to person, place, and time. She appears well-developed.  Neck: Normal range of motion.  Pulmonary/Chest: Effort normal.  Genitourinary:    There is rash, tenderness and lesion on the right labia. There is no rash, tenderness, lesion or injury on the left labia.  Genitourinary Comments: SMALL ULCERATED AREA RT LABIA MAJORA; TENDER TO PALPATE; NO D/C TODAY (STARTING TO SCAB)  Musculoskeletal: Normal range of motion.  Neurological: She is alert and oriented to person, place, and time. No cranial nerve deficit.  Psychiatric: She has a normal mood and affect. Her behavior is normal. Judgment and thought content normal.  Vitals reviewed.   Assessment/Plan: Vaginal lesion - Mild sx. Question herpetic given sx/hx. 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 Will f/u with results. If herpetic, no need for valtrex.  - Plan: HSV 2 antibody, IgG, Other/Misc lab test  If HSV 2, discussed partner testing/daily valtrex.     Return if symptoms worsen or fail to improve.  Evaan Tidwell B. Harjot Dibello, PA-C 09/28/2017 2:50 PM

## 2017-09-28 NOTE — Patient Instructions (Signed)
I value your feedback and entrusting us with your care. If you get a Karen Wallace patient survey, I would appreciate you taking the time to let us know about your experience today. Thank you! 

## 2017-09-29 LAB — HSV 2 ANTIBODY, IGG: HSV 2 IgG, Type Spec: 7.69 index — ABNORMAL HIGH (ref 0.00–0.90)

## 2017-09-30 ENCOUNTER — Telehealth: Payer: Self-pay | Admitting: Obstetrics and Gynecology

## 2017-09-30 ENCOUNTER — Encounter: Payer: Self-pay | Admitting: Obstetrics and Gynecology

## 2017-09-30 ENCOUNTER — Other Ambulatory Visit: Payer: Self-pay | Admitting: Obstetrics and Gynecology

## 2017-09-30 MED ORDER — VALACYCLOVIR HCL 500 MG PO TABS: 500.0000 mg | ORAL_TABLET | Freq: Every day | ORAL | 5 refills | Status: DC

## 2017-09-30 NOTE — Progress Notes (Unsigned)
Rx valtrex for HSV.

## 2017-09-30 NOTE — Telephone Encounter (Signed)
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 Still waiting for culture results. Discussed partner testing, as well as daily vs episodic tx. Pt's outbreak very mild so probably wouldn't need episodic valtrex. Pt would like daily valtrex as preventive. Rx eRxd. F/u prn.

## 2017-10-06 ENCOUNTER — Telehealth: Payer: Self-pay

## 2017-10-06 NOTE — Telephone Encounter (Signed)
It's not a standard screening lab in pregnancy so not done 2 yrs ago. Not going to pass to daughter unless had active sores in vaginal area, and then C/S would have been done.

## 2017-10-06 NOTE — Telephone Encounter (Signed)
Pt has questions for ABC.  [phone].  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  She had a daughter two years ago.  Wants to know if was tested then.  Adv probably not unless hx or outbreak.  Wants to know if it's possible she had it then and has passed it to her daughter.  Adv would need to ask ABC.  Pt can be reached at the this number.

## 2017-10-06 NOTE — Telephone Encounter (Signed)
Pt aware.

## 2017-10-08 ENCOUNTER — Ambulatory Visit: Payer: Self-pay | Admitting: Advanced Practice Midwife

## 2018-03-22 ENCOUNTER — Emergency Department
Admission: EM | Admit: 2018-03-22 | Discharge: 2018-03-22 | Disposition: A | Discharge status: Home / Self Care | Payer: Medicaid Other | Attending: Emergency Medicine | Admitting: Emergency Medicine

## 2018-03-22 ENCOUNTER — Other Ambulatory Visit: Payer: Self-pay

## 2018-03-22 DIAGNOSIS — I10 Essential (primary) hypertension: Secondary | ICD-10-CM | POA: Insufficient documentation

## 2018-03-22 DIAGNOSIS — M62830 Muscle spasm of back: Secondary | ICD-10-CM | POA: Insufficient documentation

## 2018-03-22 DIAGNOSIS — Z79899 Other long term (current) drug therapy: Secondary | ICD-10-CM | POA: Insufficient documentation

## 2018-03-22 DIAGNOSIS — Z3A08 8 weeks gestation of pregnancy: Secondary | ICD-10-CM | POA: Insufficient documentation

## 2018-03-22 DIAGNOSIS — O26899 Other specified pregnancy related conditions, unspecified trimester: Secondary | ICD-10-CM | POA: Insufficient documentation

## 2018-03-22 DIAGNOSIS — F17210 Nicotine dependence, cigarettes, uncomplicated: Secondary | ICD-10-CM | POA: Diagnosis not present

## 2018-03-22 NOTE — ED Provider Notes (Signed)
[facility] Emergency Department Provider Note   ____________________________________________    I have reviewed the triage vital signs and the nursing notes.   HISTORY  Chief Complaint Back Pain     HPI Karen Wallace is a 32 y.o. female who presents with complaints of left upper back pain.  Patient reports pain is been ongoing for 3 days.  She reports she is [redacted] weeks pregnant.  She denies abdominal pain.  No nausea or vomiting.  No fevers chills or cough.  No shortness of breath or pleurisy.  No hemoptysis.  She reports the pain is worse with movement.  She does not recall injuring the area.  Denies rash to the area.  Pain is mild  Past Medical History:  Diagnosis Date  . Abnormal Pap smear of cervix    2006/2010  . Anxiety   . Depression   . 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 GERD (gastroesophageal reflux disease)    OTC GUMMIES PRN  . Heart murmur    AS A  CHILD  . Hypertension   . Indication for care in labor and delivery, antepartum 05/23/2015  . Postpartum care following vaginal delivery 05/25/2015  . Preterm contractions 04/11/2015    Patient Active Problem List   Diagnosis Date Noted  . Biliary colic   . Postpartum care following vaginal delivery 05/25/2015  . Indication for care in labor and delivery, antepartum 05/23/2015  . Preterm contractions 04/11/2015    Past Surgical History:  Procedure Laterality Date  . APPENDECTOMY  april 2016  . CHOLECYSTECTOMY N/A 11/19/2016   Procedure: LAPAROSCOPIC CHOLECYSTECTOMY;  Surgeon: Henrene DodgePiscoya, Jose, MD;  Location: [facility] ORS;  Service: General;  Laterality: N/A;  . colposcopy of cervix     2007/2010  . DILATION AND CURETTAGE OF UTERUS      Prior to Admission medications   Medication Sig Start Date End Date Taking? Authorizing Provider  busPIRone (BUSPAR) 10 MG tablet TK 1/2 TO 1 T PO BID FOR MOOD 07/15/17   [provider]  busPIRone (BUSPAR) 5 MG tablet take 1  to 2 tablet by mouth twice a day for MOOD-PRN 08/04/16   [provider]  fluticasone (FLONASE) 50 MCG/ACT nasal spray instill 2 sprays into each nostril once daily as needed for allergies 09/17/16   [provider]  ibuprofen (ADVIL,MOTRIN) 200 MG tablet Take 400 mg by mouth every 8 (eight) hours as needed for mild pain.    [provider]  NIFEdipine (PROCARDIA-XL/ADALAT CC) 30 MG 24 hr tablet take 1 tablet by mouth once daily WITH 60 MG TABLETS FOR A TOTAL 90...  (REFER TO PRESCRIPTION NOTES).-BEDTIME 06/24/16   [provider]  NIFEdipine (PROCARDIA-XL/ADALAT CC) 60 MG 24 hr tablet Take 60 mg by mouth See admin instructions. Takes a 60 mg and 30 mg to equal 90 mg @ BEDTIME 06/13/16   [provider]  norethindrone (MICRONOR,CAMILA,ERRIN) 0.35 MG tablet TAKE 1 TABLET BY MOUTH ONCE DAILY Patient not taking: Reported on 09/28/2017 09/23/17   Tresea MallGledhill, Jane, CNM  valACYclovir (VALTREX) 500 MG tablet Take 1 tablet (500 mg total) by mouth daily. 09/30/17   Copland, Ilona SorrelAlicia B, PA-C     Allergies Lisinopril  Family History  Problem Relation Age of Onset  . Hypertension Mother   . Depression Mother   . Hypertension Father     Social History Social History   Tobacco Use  . Smoking status: Current Every Day Smoker  Packs/day: 0.25    Years: 9.00    Pack years: 2.25    Types: Cigarettes  . Smokeless tobacco: Never Used  Substance Use Topics  . Alcohol use: Yes    Comment: OCC  . Drug use: No    Review of Systems  Constitutional: No fever/chills  ENT: No neck pain   Gastrointestinal: No abdominal pain.  No nausea, no vomiting.   Genitourinary: Negative for vaginal bleeding Musculoskeletal: As above Skin: Negative for rash.     ____________________________________________   PHYSICAL EXAM:  VITAL SIGNS: ED Triage Vitals  Enc Vitals Group     BP 03/22/18 1226 (!) 156/101     Pulse Rate 03/22/18 1226 98     Resp 03/22/18 1226 18      Temp 03/22/18 1226 98.5 F (36.9 C)     Temp Source 03/22/18 1226 Oral     SpO2 03/22/18 1226 100 %     Weight 03/22/18 1224 98 kg (216 lb)     Height 03/22/18 1224 1.727 m (5\' 8" )     Head Circumference --      Peak Flow --      Pain Score 03/22/18 1224 10     Pain Loc --      Pain Edu? --      Excl. in GC? --     Constitutional: Alert and oriented. No acute distress.  Eyes: Conjunctivae are normal.   Nose: No congestion/rhinnorhea. Mouth/Throat: Mucous membranes are moist.   Cardiovascular: Normal rate, regular rhythm.  Respiratory: Normal respiratory effort.  No retractions.  Musculoskeletal: Back: Point tenderness along the medial/inferior border of the scapula on the left, consistent with muscle spasm.  No vertebral tenderness to palpation.  Normal strength in all extremities Neurologic:  Normal speech and language. No gross focal neurologic deficits are appreciated.   Skin:  Skin is warm, dry and intact. No rash noted.   ____________________________________________   LABS (all labs ordered are listed, but only abnormal results are displayed)  Labs Reviewed - No data to display ____________________________________________  EKG   ____________________________________________  RADIOLOGY  None ____________________________________________   PROCEDURES  Procedure(s) performed: No  Procedures   Critical Care performed: No ____________________________________________   INITIAL IMPRESSION / ASSESSMENT AND PLAN / ED COURSE  Pertinent labs & imaging results that were available during my care of the patient were reviewed by me and considered in my medical decision making (see chart for details).  Patient presents with likely muscular skeletal back pain, she is [redacted] weeks pregnant.  Recommend Tylenol, rest, supportive care.  Follow-up with GYN regarding blood pressure control during pregnancy   ____________________________________________   FINAL CLINICAL  IMPRESSION(S) / ED DIAGNOSES  Final diagnoses:  Muscle spasm of back      NEW MEDICATIONS STARTED DURING THIS VISIT:  Discharge Medication List as of 03/22/2018  1:12 PM       Note:  This document was prepared using Dragon voice recognition software and may include unintentional dictation errors.    Jene Every, MD 03/22/18 [id number]

## 2018-03-22 NOTE — ED Triage Notes (Addendum)
Pt come via POV from home with c/o mid upper back pain. Pt states this has been going on since Friday and hasn't gotten any better.  Pt denies any recent falls or injuries. Pt ambulatory to room at this time.  Pt is [redacted] weeks pregnant at this time.

## 2018-03-22 NOTE — ED Triage Notes (Signed)
Upper back pain.  Says [redacted] week pregnant

## 2018-03-29 ENCOUNTER — Ambulatory Visit (INDEPENDENT_AMBULATORY_CARE_PROVIDER_SITE_OTHER): Payer: Medicaid Other | Admitting: Advanced Practice Midwife

## 2018-03-29 ENCOUNTER — Encounter: Payer: Self-pay | Admitting: Advanced Practice Midwife

## 2018-03-29 ENCOUNTER — Other Ambulatory Visit (INDEPENDENT_AMBULATORY_CARE_PROVIDER_SITE_OTHER): Payer: Medicaid Other

## 2018-03-29 ENCOUNTER — Other Ambulatory Visit (HOSPITAL_COMMUNITY)
Admission: RE | Admit: 2018-03-29 | Discharge: 2018-03-29 | Disposition: A | Discharge status: Home / Self Care | Payer: Medicaid Other | Source: Ambulatory Visit | Attending: Advanced Practice Midwife | Admitting: Advanced Practice Midwife

## 2018-03-29 ENCOUNTER — Other Ambulatory Visit: Payer: Self-pay | Admitting: Advanced Practice Midwife

## 2018-03-29 VITALS — BP 130/88 | Wt 215.0 lb

## 2018-03-29 DIAGNOSIS — Z3A08 8 weeks gestation of pregnancy: Secondary | ICD-10-CM

## 2018-03-29 DIAGNOSIS — O3481 Maternal care for other abnormalities of pelvic organs, first trimester: Secondary | ICD-10-CM

## 2018-03-29 DIAGNOSIS — N83202 Unspecified ovarian cyst, left side: Secondary | ICD-10-CM

## 2018-03-29 DIAGNOSIS — A6009 Herpesviral infection of other urogenital tract: Secondary | ICD-10-CM

## 2018-03-29 DIAGNOSIS — O98311 Other infections with a predominantly sexual mode of transmission complicating pregnancy, first trimester: Secondary | ICD-10-CM

## 2018-03-29 DIAGNOSIS — O10911 Unspecified pre-existing hypertension complicating pregnancy, first trimester: Secondary | ICD-10-CM

## 2018-03-29 DIAGNOSIS — O209 Hemorrhage in early pregnancy, unspecified: Secondary | ICD-10-CM | POA: Diagnosis not present

## 2018-03-29 DIAGNOSIS — O99210 Obesity complicating pregnancy, unspecified trimester: Secondary | ICD-10-CM

## 2018-03-29 DIAGNOSIS — O0990 Supervision of high risk pregnancy, unspecified, unspecified trimester: Secondary | ICD-10-CM | POA: Diagnosis present

## 2018-03-29 DIAGNOSIS — Z3491 Encounter for supervision of normal pregnancy, unspecified, first trimester: Secondary | ICD-10-CM

## 2018-03-29 DIAGNOSIS — Z113 Encounter for screening for infections with a predominantly sexual mode of transmission: Secondary | ICD-10-CM | POA: Diagnosis present

## 2018-03-29 DIAGNOSIS — O99341 Other mental disorders complicating pregnancy, first trimester: Secondary | ICD-10-CM | POA: Diagnosis not present

## 2018-03-29 DIAGNOSIS — O10919 Unspecified pre-existing hypertension complicating pregnancy, unspecified trimester: Secondary | ICD-10-CM

## 2018-03-29 DIAGNOSIS — O99211 Obesity complicating pregnancy, first trimester: Secondary | ICD-10-CM | POA: Diagnosis not present

## 2018-03-29 DIAGNOSIS — O98319 Other infections with a predominantly sexual mode of transmission complicating pregnancy, unspecified trimester: Secondary | ICD-10-CM

## 2018-03-29 DIAGNOSIS — F4312 Post-traumatic stress disorder, chronic: Secondary | ICD-10-CM

## 2018-03-29 NOTE — Progress Notes (Signed)
NOB today. [facility] L[facility] 10/[facility]

## 2018-03-29 NOTE — Patient Instructions (Signed)
Prenatal Care WHAT IS PRENATAL CARE? Prenatal care is the process of caring for a pregnant woman before she gives birth. Prenatal care makes sure that she and her baby remain as healthy as possible throughout pregnancy. Prenatal care may be provided by a midwife, family practice health care provider, or a childbirth and pregnancy specialist (obstetrician). Prenatal care may include physical examinations, testing, treatments, and education on nutrition, lifestyle, and social support services. WHY IS PRENATAL CARE SO IMPORTANT? Early and consistent prenatal care increases the chance that you and your baby will remain healthy throughout your pregnancy. This type of care also decreases a baby's risk of being born too early (prematurely), or being born smaller than expected (small for gestational age). Any underlying medical conditions you may have that could pose a risk during your pregnancy are discussed during prenatal care visits. You will also be monitored regularly for any new conditions that may arise during your pregnancy so they can be treated quickly and effectively. WHAT HAPPENS DURING PRENATAL CARE VISITS? Prenatal care visits may include the following: Discussion Tell your health care provider about any new signs or symptoms you have experienced since your last visit. These might include:  Nausea or vomiting.  Increased or decreased level of energy.  Difficulty sleeping.  Back or leg pain.  Weight changes.  Frequent urination.  Shortness of breath with physical activity.  Changes in your skin, such as the development of a rash or itchiness.  Vaginal discharge or bleeding.  Feelings of excitement or nervousness.  Changes in your baby's movements.  You may want to write down any questions or topics you want to discuss with your health care provider and bring them with you to your appointment. Examination During your first prenatal care visit, you will likely have a complete  physical exam. Your health care provider will often examine your vagina, cervix, and the position of your uterus, as well as check your heart, lungs, and other body systems. As your pregnancy progresses, your health care provider will measure the size of your uterus and your baby's position inside your uterus. He or she may also examine you for early signs of labor. Your prenatal visits may also include checking your blood pressure and, after about 10-12 weeks of pregnancy, listening to your baby's heartbeat. Testing Regular testing often includes:  Urinalysis. This checks your urine for glucose, protein, or signs of infection.  Blood count. This checks the levels of white and red blood cells in your body.  Tests for sexually transmitted infections (STIs). Testing for STIs at the beginning of pregnancy is routinely done and is required in many states.  Antibody testing. You will be checked to see if you are immune to certain illnesses, such as rubella, that can affect a developing fetus.  Glucose screen. Around 24-28 weeks of pregnancy, your blood glucose level will be checked for signs of gestational diabetes. Follow-up tests may be recommended.  Group B strep. This is a bacteria that is commonly found inside a woman's vagina. This test will inform your health care provider if you need an antibiotic to reduce the amount of this bacteria in your body prior to labor and childbirth.  Ultrasound. Many pregnant women undergo an ultrasound screening around 18-20 weeks of pregnancy to evaluate the health of the fetus and check for any developmental abnormalities.  HIV (human immunodeficiency virus) testing. Early in your pregnancy, you will be screened for HIV. If you are at high risk for HIV, this test may   be repeated during your third trimester of pregnancy.  You may be offered other testing based on your age, personal or family medical history, or other factors. HOW OFTEN SHOULD I PLAN TO SEE MY  HEALTH CARE PROVIDER FOR PRENATAL CARE? Your prenatal care check-up schedule depends on any medical conditions you have before, or develop during, your pregnancy. If you do not have any underlying medical conditions, you will likely be seen for checkups:  Monthly, during the first 6 months of pregnancy.  Twice a month during months 7 and 8 of pregnancy.  Weekly starting in the 9th month of pregnancy and until delivery.  If you develop signs of early labor or other concerning signs or symptoms, you may need to see your health care provider more often. Ask your health care provider what prenatal care schedule is best for you. WHAT CAN I DO TO KEEP MYSELF AND MY BABY AS HEALTHY AS POSSIBLE DURING MY PREGNANCY?  Take a prenatal vitamin containing 400 micrograms (0.4 mg) of folic acid every day. Your health care provider may also ask you to take additional vitamins such as iodine, vitamin D, iron, copper, and zinc.  Take 1500-2000 mg of calcium daily starting at your 20th week of pregnancy until you deliver your baby.  Make sure you are up to date on your vaccinations. Unless directed otherwise by your health care provider: ? You should receive a tetanus, diphtheria, and pertussis (Tdap) vaccination between the 27th and 36th week of your pregnancy, regardless of when your last Tdap immunization occurred. This helps protect your baby from whooping cough (pertussis) after he or she is born. ? You should receive an annual inactivated influenza vaccine (IIV) to help protect you and your baby from influenza. This can be done at any point during your pregnancy.  Eat a well-rounded diet that includes: ? Fresh fruits and vegetables. ? Lean proteins. ? Calcium-rich foods such as milk, yogurt, hard cheeses, and dark, leafy greens. ? Whole grain breads.  Do noteat seafood high in mercury, including: ? Swordfish. ? Tilefish. ? Shark. ? King mackerel. ? More than 6 oz tuna per week.  Do not  eat: ? Raw or undercooked meats or eggs. ? Unpasteurized foods, such as soft cheeses (brie, blue, or feta), juices, and milks. ? Lunch meats. ? Hot dogs that have not been heated until they are steaming.  Drink enough water to keep your urine clear or pale yellow. For many women, this may be 10 or more 8 oz glasses of water each day. Keeping yourself hydrated helps deliver nutrients to your baby and may prevent the start of pre-term uterine contractions.  Do not use any tobacco products including cigarettes, chewing tobacco, or electronic cigarettes. If you need help quitting, ask your health care provider.  Do not drink beverages containing alcohol. No safe level of alcohol consumption during pregnancy has been determined.  Do not use any illegal drugs. These can harm your developing baby or cause a miscarriage.  Ask your health care provider or pharmacist before taking any prescription or over-the-counter medicines, herbs, or supplements.  Limit your caffeine intake to no more than 200 mg per day.  Exercise. Unless told otherwise by your health care provider, try to get 30 minutes of moderate exercise most days of the week. Do not  do high-impact activities, contact sports, or activities with a high risk of falling, such as horseback riding or downhill skiing.  Get plenty of rest.  Avoid anything that raises your   body temperature, such as hot tubs and saunas.  If you own a cat, do not empty its litter box. Bacteria contained in cat feces can cause an infection called toxoplasmosis. This can result in serious harm to the fetus.  Stay away from chemicals such as insecticides, lead, mercury, and cleaning or paint products that contain solvents.  Do not have any X-rays taken unless medically necessary.  Take a childbirth and breastfeeding preparation class. Ask your health care provider if you need a referral or recommendation.  This information is not intended to replace advice given  to you by your health care provider. Make sure you discuss any questions you have with your health care provider. Document Released: 04/10/2003 Document Revised: 09/10/2015 Document Reviewed: 06/22/2013 Else[employer] Interactive Patient Education  2017 [employer] Eating Plan for Pregnant Women While you are pregnant, your body will require additional nutrition to help support your growing baby. It is recommended that you consume:  150 additional calories each day during your first trimester.  300 additional calories each day during your second trimester.  300 additional calories each day during your third trimester.  Eating a healthy, well-balanced diet is very important for your health and for your baby's health. You also have a higher need for some vitamins and minerals, such as folic acid, calcium, iron, and vitamin D. What do I need to know about eating during pregnancy?  Do not try to lose weight or go on a diet during pregnancy.  Choose healthy, nutritious foods. Choose  of a sandwich with a glass of milk instead of a candy bar or a high-calorie sugar-sweetened beverage.  Limit your overall intake of foods that have "empty calories." These are foods that have little nutritional value, such as sweets, desserts, candies, sugar-sweetened beverages, and fried foods.  Eat a variety of foods, especially fruits and vegetables.  Take a prenatal vitamin to help meet the additional needs during pregnancy, specifically for folic acid, iron, calcium, and vitamin D.  Remember to stay active. Ask your health care provider for exercise recommendations that are specific to you.  Practice good food safety and cleanliness, such as washing your hands before you eat and after you prepare raw meat. This helps to prevent foodborne illnesses, such as listeriosis, that can be very dangerous for your baby. Ask your health care provider for more information about listeriosis. What does 150 extra calories  look like? Healthy options for an additional 150 calories each day could be any of the following:  Plain low-fat yogurt (6-8 oz) with  cup of berries.  1 apple with 2 teaspoons of peanut butter.  Cut-up vegetables with  cup of hummus.  Low-fat chocolate milk (8 oz or 1 cup).  1 string cheese with 1 medium orange.   of a peanut butter and jelly sandwich on whole-wheat bread (1 tsp of peanut butter).  For 300 calories, you could eat two of those healthy options each day. What is a healthy amount of weight to gain? The recommended amount of weight for you to gain is based on your pre-pregnancy BMI. If your pre-pregnancy BMI was:  Less than 18 (underweight), you should gain 28-40 lb.  18-24.9 (normal), you should gain 25-35 lb.  25-29.9 (overweight), you should gain 15-25 lb.  Greater than 30 (obese), you should gain 11-20 lb.  What if I am having twins or multiples? Generally, pregnant women who will be having twins or multiples may need to increase their daily calories by 300-600 calories each   day. The recommended range for total weight gain is 25-54 lb, depending on your pre-pregnancy BMI. Talk with your health care provider for specific guidance about additional nutritional needs, weight gain, and exercise during your pregnancy. What foods can I eat? Grains Any grains. Try to choose whole grains, such as whole-wheat bread, oatmeal, or brown rice. Vegetables Any vegetables. Try to eat a variety of colors and types of vegetables to get a full range of vitamins and minerals. Remember to wash your vegetables well before eating. Fruits Any fruits. Try to eat a variety of colors and types of fruit to get a full range of vitamins and minerals. Remember to wash your fruits well before eating. Meats and Other Protein Sources Lean meats, including chicken, turkey, fish, and lean cuts of beef, veal, or pork. Make sure that all meats are cooked to "well done." Tofu. Tempeh. Beans. Eggs.  Peanut butter and other nut butters. Seafood, such as shrimp, crab, and lobster. If you choose fish, select types that are higher in omega-3 fatty acids, including salmon, herring, mussels, trout, sardines, and pollock. Make sure that all meats are cooked to food-safe temperatures. Dairy Pasteurized milk and milk alternatives. Pasteurized yogurt and pasteurized cheese. Cottage cheese. Sour cream. Beverages Water. Juices that contain 100% fruit juice or vegetable juice. Caffeine-free teas and decaffeinated coffee. Drinks that contain caffeine are okay to drink, but it is better to avoid caffeine. Keep your total caffeine intake to less than 200 mg each day (12 oz of coffee, tea, or soda) or as directed by your health care provider. Condiments Any pasteurized condiments. Sweets and Desserts Any sweets and desserts. Fats and Oils Any fats and oils. The items listed above may not be a complete list of recommended foods or beverages. Contact your dietitian for more options. What foods are not recommended? Vegetables Unpasteurized (raw) vegetable juices. Fruits Unpasteurized (raw) fruit juices. Meats and Other Protein Sources Cured meats that have nitrates, such as bacon, salami, and hotdogs. Luncheon meats, bologna, or other deli meats (unless they are reheated until they are steaming hot). Refrigerated pate, meat spreads from a meat counter, smoked seafood that is found in the refrigerated section of a store. Raw fish, such as sushi or sashimi. High mercury content fish, such as tilefish, shark, swordfish, and king mackerel. Raw meats, such as tuna or beef tartare. Undercooked meats and poultry. Make sure that all meats are cooked to food-safe temperatures. Dairy Unpasteurized (raw) milk and any foods that have raw milk in them. Soft cheeses, such as feta, queso blanco, queso fresco, Brie, Camembert cheeses, blue-veined cheeses, and Panela cheese (unless it is made with pasteurized milk, which must  be stated on the label). Beverages Alcohol. Sugar-sweetened beverages, such as sodas, teas, or energy drinks. Condiments Homemade fermented foods and drinks, such as pickles, sauerkraut, or kombucha drinks. (Store-bought pasteurized versions of these are okay.) Other Salads that are made in the store, such as ham salad, chicken salad, egg salad, tuna salad, and seafood salad. The items listed above may not be a complete list of foods and beverages to avoid. Contact your dietitian for more information. This information is not intended to replace advice given to you by your health care provider. Make sure you discuss any questions you have with your health care provider. Document Released: 01/20/2014 Document Revised: 09/13/2015 Document Reviewed: 09/20/2013 [employer] Interactive Patient Education  2018 [employer] Exercise During Pregnancy For people of all ages, exercise is an important part of being healthy. Exercise improves   heart and lung function and helps to maintain strength, flexibility, and a healthy body weight. Exercise also boosts energy levels and elevates mood. For most women, maintaining an exercise routine throughout pregnancy is recommended. It is only on rare occasions and with certain medical conditions or pregnancy complications that women may be asked to limit or avoid exercise during pregnancy. What are some other benefits to exercising during pregnancy? Along with maintaining strength and flexibility, exercising throughout pregnancy can help to:  Keep strength in muscles that are very important during labor and childbirth.  Decrease low back pain during pregnancy.  Decrease the risk of developing gestational diabetes mellitus (GDM).  Improve blood sugar (glucose) control for women who have GDM.  Decrease the risk of developing preeclampsia. This is a serious condition that causes high blood pressure along with other symptoms, such as swelling and  headaches.  Decrease the risk of cesarean delivery.  Speed up the recovery after giving birth.  How often should I exercise? Unless your health care provider gives you different instructions, you should try to exercise on most days or all days of the week. In general, try to exercise with moderate intensity for about 150 minutes per week. This can be spread out across several days, such as exercising for 30 minutes per day on 5 days of each week. You can tell that you are exercising at a moderate intensity if you have a higher heart rate and faster breathing, but you are still able to hold a conversation. What types of moderate-intensity exercise are recommended during pregnancy? There are many types of exercise that are safe for you to do during pregnancy. Unless your health care provider gives you different instructions, do a variety of exercises that safely increase your heart and breathing (cardiopulmonary) rates and help you to build and maintain muscle strength (strength training). You should always be able to talk in full sentences while exercising during pregnancy. Some examples of exercising that is safe to do during pregnancy include:  Brisk walking or hiking.  Swimming.  Water aerobics.  Riding a stationary bike.  Strength training.  Modified yoga or Pilates. Tell your instructor that you are pregnant. Avoid overstretching and avoid lying on your back for long periods of time.  Running or jogging. Only choose this type of exercise if: ? You ran or jogged regularly before your pregnancy. ? You can run or jog and still talk in complete sentences.  What types of exercise should I not do during pregnancy? Depending on your level of fitness and whether you exercised regularly before your pregnancy, you may be advised to limit vigorous-intensity exercise during your pregnancy. You can tell that you are exercising at a vigorous intensity if you are breathing much harder and faster  and cannot hold a conversation while exercising. Some examples of exercising that you should avoid during pregnancy include:  Contact sports.  Activities that place you at risk for falling on or being hit in the belly, such as downhill skiing, water skiing, surfing, rock climbing, cycling, gymnastics, and horseback riding.  Scuba diving.  Sky diving.  Yoga or Pilates in a room that is heated to extreme temperatures ("hot yoga" or "hot Pilates").  Jogging or running, unless you ran or jogged regularly before your pregnancy. While jogging or running, you should always be able to talk in full sentences. Do not run or jog so vigorously that you are unable to have a conversation.  If you are not used to exercising at   elevation (more than 6,000 feet above sea level), do not do so during your pregnancy.  When should I avoid exercising during pregnancy? Certain medical conditions can make it unsafe to exercise during pregnancy, or they may increase your risk of miscarriage or early labor and birth. Some of these conditions include:  Some types of heart disease.  Some types of lung disease.  Placenta previa. This is when the placenta partially or completely covers the opening of the uterus (cervix).  Frequent bleeding from the vagina during your pregnancy.  Incompetent cervix. This is when your cervix does not remain as tightly closed during pregnancy as it should.  Premature labor.  Ruptured membranes. This is when the protective sac (amniotic sac) opens up and amniotic fluid leaks from your vagina.  Severely low blood count (anemia).  Preeclampsia or pregnancy-caused high blood pressure.  Carrying more than one baby (multiple gestation) and having an additional risk of early labor.  Poorly controlled diabetes.  Being severely underweight or severely overweight.  Intrauterine growth restriction. This is when your baby's growth and development during pregnancy are slower than  expected.  Other medical conditions. Ask your health care provider if any apply to you.  What else should I know about exercising during pregnancy? You should take these precautions while exercising during pregnancy:  Avoid overheating. ? Wear loose-fitting, breathable clothes. ? Do not exercise in very high temperatures.  Avoid dehydration. Drink enough water before, during, and after exercise to keep your urine clear or pale yellow.  Avoid overstretching. Because of hormone changes during pregnancy, it is easy to overstretch muscles, tendons, and ligaments during pregnancy.  Start slowly and ask your health care provider to recommend types of exercise that are safe for you, if exercising regularly is new for you.  Pregnancy is not a time for exercising to lose weight. When should I seek medical care? You should stop exercising and call your health care provider if you have any unusual symptoms, such as:  Mild uterine contractions or abdominal cramping.  Dizziness that does not improve with rest.  When should I seek immediate medical care? You should stop exercising and call your local emergency services (911 in the [place] if you have any unusual symptoms, such as:  Sudden, severe pain in your low back or your belly.  Uterine contractions or abdominal cramping that do not improve with rest.  Chest pain.  Bleeding or fluid leaking from your vagina.  Shortness of breath.  This information is not intended to replace advice given to you by your health care provider. Make sure you discuss any questions you have with your health care provider. Document Released: 04/07/2005 Document Revised: 09/05/2015 Document Reviewed: 06/15/2014 [employer] Interactive Patient Education  2018 [employer]  

## 2018-03-30 ENCOUNTER — Encounter: Payer: Self-pay | Admitting: Advanced Practice Midwife

## 2018-03-30 DIAGNOSIS — O10919 Unspecified pre-existing hypertension complicating pregnancy, unspecified trimester: Secondary | ICD-10-CM | POA: Insufficient documentation

## 2018-03-30 DIAGNOSIS — O0990 Supervision of high risk pregnancy, unspecified, unspecified trimester: Secondary | ICD-10-CM | POA: Insufficient documentation

## 2018-03-30 DIAGNOSIS — F4312 Post-traumatic stress disorder, chronic: Secondary | ICD-10-CM | POA: Insufficient documentation

## 2018-03-30 DIAGNOSIS — A6009 Herpesviral infection of other urogenital tract: Secondary | ICD-10-CM | POA: Insufficient documentation

## 2018-03-30 DIAGNOSIS — O98319 Other infections with a predominantly sexual mode of transmission complicating pregnancy, unspecified trimester: Secondary | ICD-10-CM

## 2018-03-30 HISTORY — DX: Unspecified pre-existing hypertension complicating pregnancy, unspecified trimester: O10.919

## 2018-03-30 HISTORY — DX: Supervision of high risk pregnancy, unspecified, unspecified trimester: O09.90

## 2018-03-30 LAB — URINE DRUG PANEL 7
Amphetamines, Urine: NEGATIVE ng/mL
BARBITURATE QUANT UR: NEGATIVE ng/mL
BENZODIAZEPINE QUANT UR: NEGATIVE ng/mL
CANNABINOID QUANT UR: NEGATIVE ng/mL
Cocaine (Metab.): NEGATIVE ng/mL
OPIATE QUANT UR: NEGATIVE ng/mL
PCP Quant, Ur: NEGATIVE ng/mL

## 2018-03-30 LAB — CERVICOVAGINAL ANCILLARY ONLY
Chlamydia: NEGATIVE
Neisseria Gonorrhea: NEGATIVE
Trichomonas: POSITIVE — AB

## 2018-03-30 NOTE — Progress Notes (Signed)
New Obstetric Patient H&P    Chief Complaint: "Desires prenatal care"   History of Present Illness: Patient is a 32 y.o. [id number] Not Hispanic or Latino female, presents with amenorrhea and positive home pregnancy test. Patient's last menstrual period was 01/20/2018. She also spotted for 2 days on 02/02/2018. Based on ultrasound today, her EDD is Estimated Date of Delivery: 11/07/18 and her EGA is [redacted]w[redacted]d. Cycles are 7. days, regular, and occur approximately every : 21 days. Her last pap smear was 1 year ago and was no abnormalities.    She had a urine pregnancy test which was positive 4 week(s)  ago. Her last menstrual period was light and lasted for  2 day(s). Since her LMP she claims she has experienced breast tenderness, fatigue, nausea. She denies vaginal bleeding. Her past medical history is contributory for chronic hypertension- recently stopped Nifedipine per discussion with Phineas Real, PTSD with anxiety and depression, genital herpes. Her prior pregnancies are notable for G1 SAB 2007, G2 FT SVD 7#6oz 2009, G3 FT SVD 6#7oz 2017, G4 current  Since her LMP, she admits to the use of tobacco products  Yes 5-6 cig/day She claims she has gained   no pounds since the start of her pregnancy.  There are cats in the home in the home  no  She admits close contact with children on a regular basis  yes  She has had chicken pox in the past yes She has had Tuberculosis exposures, symptoms, or previously tested positive for TB   no Current or past history of domestic violence. no  Genetic Screening/Teratology Counseling: (Includes patient, baby's father, or anyone in either family with:)   1. Patient's age >/= 105 at [facility]  no 2. Thalassemia ([place], [place], Mediterranean, or Asian background): MCV<80  no 3. Neural tube defect (meningomyelocele, spina bifida, anencephaly)  no 4. Congenital heart defect  no  5. Down syndrome  no 6. Tay-Sachs (Jewish, [place]  no 7. Canavan's Disease  no 8.  Sickle cell disease or trait (African)  no  9. Hemophilia or other blood disorders  no  10. Muscular dystrophy  no  11. Cystic fibrosis  no  12. Huntington's Chorea  no  13. Mental retardation/autism  no 14. Other inherited genetic or chromosomal disorder  no 15. Maternal metabolic disorder (DM, PKU, etc)  no 16. Patient or FOB with a child with a birth defect not listed above no  16a. Patient or FOB with a birth defect themselves no 17. Recurrent pregnancy loss, or stillbirth  no  18. Any medications since LMP other than prenatal vitamins (include vitamins, supplements, OTC meds, drugs, alcohol)  no 19. Any other genetic/environmental exposure to discuss  no  Infection History:   1. Lives with someone with TB or TB exposed  no  2. Patient or partner has history of genital herpes  yes 3. Rash or viral illness since LMP  no 4. History of STI (GC, CT, HPV, syphilis, HIV)  no 5. History of recent travel :  no  Other pertinent information:  no     Review of Systems:10 point review of systems negative unless otherwise noted in HPI  Past Medical History:  Past Medical History:  Diagnosis Date  . Abnormal Pap smear of cervix    2006/2010  . Anxiety   . Depression   . 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 GERD (gastroesophageal reflux disease)    OTC GUMMIES PRN  . Heart  murmur    AS A  CHILD  . Hypertension   . Indication for care in labor and delivery, antepartum 05/23/2015  . Postpartum care following vaginal delivery 05/25/2015  . Preterm contractions 04/11/2015    Past Surgical History:  Past Surgical History:  Procedure Laterality Date  . APPENDECTOMY  april 2016  . CHOLECYSTECTOMY N/A 11/19/2016   Procedure: LAPAROSCOPIC CHOLECYSTECTOMY;  Surgeon: Henrene Dodge, MD;  Location: [facility] ORS;  Service: General;  Laterality: N/A;  . colposcopy of cervix     2007/2010  . DILATION AND CURETTAGE OF UTERUS      Gynecologic History: Patient's last menstrual  period was 01/20/2018.  Obstetric History: [id number]  Family History:  Family History  Problem Relation Age of Onset  . Hypertension Mother   . Depression Mother   . Hypertension Father     Social History:  Social History   Socioeconomic History  . Marital status: Single    Spouse name: Not on file  . Number of children: Not on file  . Years of education: Not on file  . Highest education level: Not on file  Occupational History  . Not on file  Social Needs  . Financial resource strain: Not on file  . Food insecurity:    Worry: Not on file    Inability: Not on file  . Transportation needs:    Medical: Not on file    Non-medical: Not on file  Tobacco Use  . Smoking status: Current Every Day Smoker    Packs/day: 0.25    Years: 9.00    Pack years: 2.25    Types: Cigarettes  . Smokeless tobacco: Never Used  Substance and Sexual Activity  . Alcohol use: Not Currently    Comment: OCC  . Drug use: No  . Sexual activity: Yes    Birth control/protection: None  Lifestyle  . Physical activity:    Days per week: Not on file    Minutes per session: Not on file  . Stress: Not on file  Relationships  . Social connections:    Talks on phone: Not on file    Gets together: Not on file    Attends religious service: Not on file    Active member of club or organization: Not on file    Attends meetings of clubs or organizations: Not on file    Relationship status: Not on file  . Intimate partner violence:    Fear of current or ex partner: Not on file    Emotionally abused: Not on file    Physically abused: Not on file    Forced sexual activity: Not on file  Other Topics Concern  . Not on file  Social History Narrative  . Not on file    Allergies:  Allergies  Allergen Reactions  . Lisinopril Cough    Medications: Prior to Admission medications   Medication Sig Start Date End Date Taking? Authorizing Provider  busPIRone (BUSPAR) 10 MG tablet TK 1/2 TO 1 T PO BID FOR  MOOD 07/15/17  Yes [provider]  NIFEdipine (PROCARDIA-XL/ADALAT CC) 30 MG 24 hr tablet take 1 tablet by mouth once daily WITH 60 MG TABLETS FOR A TOTAL 90...  (REFER TO PRESCRIPTION NOTES).-BEDTIME 06/24/16  Yes [provider]  valACYclovir (VALTREX) 500 MG tablet Take 1 tablet (500 mg total) by mouth daily. 09/30/17  Yes Copland, Ilona Sorrel, PA-C  NIFEdipine (PROCARDIA-XL/ADALAT CC) 60 MG 24 hr tablet Take 60 mg by mouth See admin instructions. Takes  a 60 mg and 30 mg to equal 90 mg @ BEDTIME 06/13/16   [provider]    Physical Exam Vitals: Blood pressure 130/88, weight 215 lb (97.5 kg), last menstrual period 01/20/2018.  General: NAD HEENT: normocephalic, anicteric Thyroid: no enlargement, no palpable nodules Pulmonary: No increased work of breathing, CTAB Cardiovascular: RRR, distal pulses 2+ Abdomen: NABS, soft, non-tender, non-distended.  Umbilicus without lesions.  No hepatomegaly, splenomegaly or masses palpable. No evidence of hernia  Genitourinary: deferred for PAP interval/no concerns/u/s today Extremities: no edema, erythema, or tenderness Neurologic: Grossly intact Psychiatric: mood appropriate, affect full   Assessment: 32 y.o. [id number] at 549w2d presenting to initiate prenatal care  Plan: 1) Avoid alcoholic beverages. 2) Patient encouraged not to smoke.  3) Discontinue the use of all non-medicinal drugs and chemicals.  4) Take prenatal vitamins daily.  5) Nutrition, food safety (fish, cheese advisories, and high nitrite foods) and exercise discussed. 6) Hospital and practice style discussed with cross coverage system.  7) Genetic Screening, such as with 1st Trimester Screening, cell free fetal DNA, AFP testing, and Ultrasound, as well as with amniocentesis and CVS as appropriate, is discussed with patient. At the conclusion of today's visit patient requested genetic testing 8) Patient is asked about travel to areas at risk for the Zika virus, and  counseled to avoid travel and exposure to mosquitoes or sexual partners who may have themselves been exposed to the virus. Testing is discussed, and will be ordered as appropriate.  9) Dating u/s and urine labs today 10) Return in 3 weeks for early glucola, MaterniT21, NOB panel and Sickle Cell screen   Tresea MallJane Allie Gerhold, CNM [facility], [facility] 03/30/2018, 2:05 PM

## 2018-03-31 LAB — URINE CULTURE

## 2018-04-06 ENCOUNTER — Other Ambulatory Visit: Payer: Self-pay | Admitting: Advanced Practice Midwife

## 2018-04-06 DIAGNOSIS — A599 Trichomoniasis, unspecified: Secondary | ICD-10-CM | POA: Insufficient documentation

## 2018-04-06 MED ORDER — METRONIDAZOLE 500 MG PO TABS: 500.0000 mg | ORAL_TABLET | Freq: Two times a day (BID) | ORAL | 0 refills | Status: AC

## 2018-04-06 NOTE — Progress Notes (Signed)
Rx sent metronidazole. Voicemail left for patient to pick up medication and to call office with questions.

## 2018-04-09 ENCOUNTER — Telehealth: Payer: Self-pay

## 2018-04-09 NOTE — Telephone Encounter (Signed)
Pt aware.

## 2018-04-09 NOTE — Telephone Encounter (Signed)
[place] SinkRita you can let her know that she can take Gummy prenatals for now if she is nauseated. They are usually less nauseating. The mg of iron is about the same in all prenatals.The gummies don't usually have iron.  Thanks, Erskine SquibbJane

## 2018-04-09 NOTE — Telephone Encounter (Signed)
JEG gave sample of prenatal vitamins.  She has been nauseated since starting them.  It's Concept DHA and it has 32gms of iron.  Can she have something c less iron?  [phone]

## 2018-04-19 ENCOUNTER — Other Ambulatory Visit: Payer: Medicaid Other

## 2018-04-19 ENCOUNTER — Ambulatory Visit (INDEPENDENT_AMBULATORY_CARE_PROVIDER_SITE_OTHER): Payer: Medicaid Other | Admitting: Advanced Practice Midwife

## 2018-04-19 ENCOUNTER — Telehealth: Payer: Self-pay | Admitting: Advanced Practice Midwife

## 2018-04-19 ENCOUNTER — Encounter: Payer: Self-pay | Admitting: Advanced Practice Midwife

## 2018-04-19 ENCOUNTER — Other Ambulatory Visit: Payer: Self-pay | Admitting: Advanced Practice Midwife

## 2018-04-19 VITALS — BP 128/70 | Wt 214.0 lb

## 2018-04-19 DIAGNOSIS — O99211 Obesity complicating pregnancy, first trimester: Secondary | ICD-10-CM

## 2018-04-19 DIAGNOSIS — O99210 Obesity complicating pregnancy, unspecified trimester: Secondary | ICD-10-CM

## 2018-04-19 DIAGNOSIS — O0990 Supervision of high risk pregnancy, unspecified, unspecified trimester: Secondary | ICD-10-CM

## 2018-04-19 DIAGNOSIS — Z113 Encounter for screening for infections with a predominantly sexual mode of transmission: Secondary | ICD-10-CM

## 2018-04-19 DIAGNOSIS — Z3A11 11 weeks gestation of pregnancy: Secondary | ICD-10-CM

## 2018-04-19 LAB — POCT URINALYSIS DIPSTICK OB
GLUCOSE, UA: NEGATIVE
POC,PROTEIN,UA: NEGATIVE

## 2018-04-19 MED ORDER — PROVIDA OB 20-20-1.25 MG PO CAPS: 1.0000 | ORAL_CAPSULE | Freq: Every day | ORAL | 11 refills | Status: DC

## 2018-04-19 NOTE — Progress Notes (Signed)
Rx sent Provida Ob per patient request

## 2018-04-19 NOTE — Patient Instructions (Signed)

## 2018-04-19 NOTE — Telephone Encounter (Signed)
I sent Provida Ob Rx to her pharmacy. Thanks for letting her know.

## 2018-04-19 NOTE — Telephone Encounter (Signed)
Please advise 

## 2018-04-19 NOTE — Progress Notes (Signed)
  Routine Prenatal Care Visit  Subjective  Karen AbbottSherita D Wallace is a 32 y.o. [phone] at 629w1d being seen today for ongoing prenatal care.  She is currently monitored for the following issues for this high-risk pregnancy and has Chronic hypertension affecting pregnancy; Chronic post-traumatic stress disorder (PTSD); Genital herpes affecting pregnancy; Supervision of high risk pregnancy, antepartum; and Trichomonas vaginalis infection on their problem list.  ----------------------------------------------------------------------------------- Patient reports some lower abdominal pain a few days ago. She denies cramping. She took her course of Metronidazole for treatment of trichomoniasis  . Vag. Bleeding: None.   . Denies leaking of fluid.  ----------------------------------------------------------------------------------- The following portions of the patient's history were reviewed and updated as appropriate: allergies, current medications, past family history, past medical history, past social history, past surgical history and problem list. Problem list updated.   Objective  Blood pressure 128/70, weight 214 lb (97.1 kg), last menstrual period 01/20/2018. Pregravid weight 215 lb (97.5 kg) Total Weight Gain -1 lb (-0.454 kg) Urinalysis: Urine Protein Negative  Urine Glucose Negative  Fetal Status: Fetal Heart Rate (bpm): 162         General:  Alert, oriented and cooperative. Patient is in no acute distress.  Skin: Skin is warm and dry. No rash noted.   Cardiovascular: Normal heart rate noted  Respiratory: Normal respiratory effort, no problems with respiration noted  Abdomen: Soft, gravid, appropriate for gestational age. Pain/Pressure: Absent     Pelvic:  Cervical exam deferred        Extremities: Normal range of motion.     Mental Status: Normal mood and affect. Normal behavior. Normal judgment and thought content.   Assessment   32 y.o. [id number] at 519w1d by  11/07/2018, by Ultrasound  presenting for routine prenatal visit  Plan   pregnancy Problems (from 03/29/18 to present)    Problem Noted Resolved   Trichomonas vaginalis infection 04/06/2018 by Tresea MallGledhill, Adriene Knipfer, CNM No   Chronic hypertension affecting pregnancy 03/30/2018 by Tresea MallGledhill, Kathelyn Gombos, CNM No   Chronic post-traumatic stress disorder (PTSD) 03/30/2018 by Tresea MallGledhill, Zipporah Finamore, CNM No   Genital herpes affecting pregnancy 03/30/2018 by Tresea MallGledhill, Carder Yin, CNM No       Preterm labor symptoms and general obstetric precautions including but not limited to vaginal bleeding, contractions, leaking of fluid and fetal movement were reviewed in detail with the patient. Please refer to After Visit Summary for other counseling recommendations.   Return in about 4 weeks (around 05/17/2018) for MD visit- rob.  Tresea MallJane Jaeda Bruso, CNM 04/19/2018 11:18 AM

## 2018-04-19 NOTE — Progress Notes (Signed)
ROB Few days ago abdominal pain, no spotting Early GTT and labs Will think about Flu vaccine

## 2018-04-19 NOTE — Telephone Encounter (Signed)
Patient seen by Erskine SquibbJane this morning, meant to ask for Rx for prenatal vitamin, needs low iron dose she states.  [facility] [address].

## 2018-04-20 LAB — RPR+RH+ABO+RUB AB+AB SCR+CB...
ANTIBODY SCREEN: NEGATIVE
HEMATOCRIT: 35.5 % (ref 34.0–46.6)
HEP B S AG: NEGATIVE
HIV Screen 4th Generation wRfx: NONREACTIVE
Hemoglobin: 12 g/dL (ref 11.1–15.9)
MCH: 32.4 pg (ref 26.6–33.0)
MCHC: 33.8 g/dL (ref 31.5–35.7)
MCV: 96 fL (ref 79–97)
Platelets: 273 10*3/uL (ref 150–450)
RBC: 3.7 x10E6/uL — ABNORMAL LOW (ref 3.77–5.28)
RDW: 12 % — AB (ref 12.3–15.4)
RH TYPE: POSITIVE
RPR Ser Ql: NONREACTIVE
Rubella Antibodies, IGG: 1.8 index (ref >0.99)
Varicella zoster IgG: 744 index (ref >165)
WBC: 11.5 10*3/uL — ABNORMAL HIGH (ref 3.4–10.8)

## 2018-04-20 LAB — GLUCOSE, 1 HOUR GESTATIONAL: Gestational Diabetes Screen: 93 mg/dL (ref 65–139)

## 2018-04-20 LAB — HEMOGLOBINOPATHY EVALUATION
HEMOGLOBIN A2 QUANTITATION: 2.4 % (ref 1.8–3.2)
HEMOGLOBIN F QUANTITATION: 0 % (ref 0.0–2.0)
HGB C: 0 %
HGB S: 0 %
HGB VARIANT: 0 %
Hgb A: 97.6 % (ref 96.4–98.8)

## 2018-04-20 NOTE — Telephone Encounter (Signed)
Pt aware.

## 2018-04-21 NOTE — L&D Delivery Note (Signed)
Date of delivery: 11/01/2018 Estimated Date of Delivery: 11/07/18 Patient's last menstrual period was 01/20/2018. EGA: [redacted]w[redacted]d  Delivery Note At 10:24 AM a viable female was delivered via Vaginal, Spontaneous (Presentation: OA;  ROA;  ROT).  APGAR: 8, 8; weight:  3600 g, 7 pounds 15 ounces.   Placenta status: spontaneous, intact.  Cord:  with the following complications: none.  Cord pH: NA  Mom progressed well to complete after SROM at 7:35 AM. She pushed to deliver a viable female infant.  The head followed by shoulders, which delivered without difficulty after further rotation to ROT, and the rest of the body.  Loose nuchal cord noted and reduced with birth of baby.  Baby to mom's chest.  Cord clamped and cut after 3 min delay.  Cord blood obtained.  Placenta delivered spontaneously, intact, with a 3-vessel cord.  No lacerations.  All counts correct.  Hemostasis obtained with IV pitocin and fundal massage.    Anesthesia: epidural  Episiotomy: None Lacerations: None Suture Repair: NA Est. Blood Loss (mL): 200  Mom to postpartum.  Baby to Couplet care / Skin to Skin.  Rod Can, CNM 11/01/2018, 11:04 AM

## 2018-04-22 ENCOUNTER — Telehealth: Payer: Self-pay

## 2018-04-22 NOTE — Telephone Encounter (Signed)
Spoke with patient regarding redraw of MaterniT21. She will come in next Friday for lab-only (plus weight) visit to have lab done.

## 2018-04-22 NOTE — Telephone Encounter (Signed)
Patient is calling to speak with JEG about results Please advise

## 2018-04-22 NOTE — Telephone Encounter (Signed)
Pt needs to have her mat 21 redrawn, [facility] called and states that her specimen was labeled with a different pts name.

## 2018-04-22 NOTE — Telephone Encounter (Signed)
Left voicemail for patient regarding need to have MaterniT21 redrawn.

## 2018-04-23 LAB — MATERNIT 21 PLUS CORE, BLOOD

## 2018-04-23 LAB — REQUEST PROBLEM

## 2018-04-30 ENCOUNTER — Other Ambulatory Visit: Payer: Self-pay | Admitting: Advanced Practice Midwife

## 2018-04-30 ENCOUNTER — Other Ambulatory Visit: Payer: Medicaid Other

## 2018-04-30 DIAGNOSIS — Z1379 Encounter for other screening for genetic and chromosomal anomalies: Secondary | ICD-10-CM

## 2018-04-30 NOTE — Progress Notes (Signed)
Order placed for redraw of MaterniT 21 due to labeling error with first specimen.

## 2018-05-06 ENCOUNTER — Telehealth: Payer: Self-pay

## 2018-05-06 LAB — MATERNIT 21 PLUS CORE, BLOOD
Chromosome 13: NEGATIVE
Chromosome 18: NEGATIVE
Chromosome 21: NEGATIVE
Fetal Fraction: 5
Y Chromosome: NOT DETECTED

## 2018-05-06 NOTE — Telephone Encounter (Signed)
Normal genetic screen reviewed with patient.

## 2018-05-06 NOTE — Telephone Encounter (Signed)
Pt calling for results.  [phone]

## 2018-05-14 ENCOUNTER — Encounter: Payer: Self-pay | Admitting: Emergency Medicine

## 2018-05-14 ENCOUNTER — Emergency Department: Payer: Medicaid Other

## 2018-05-14 ENCOUNTER — Emergency Department
Admission: EM | Admit: 2018-05-14 | Discharge: 2018-05-14 | Disposition: A | Discharge status: Home / Self Care | Payer: Medicaid Other | Attending: Emergency Medicine | Admitting: Emergency Medicine

## 2018-05-14 ENCOUNTER — Other Ambulatory Visit: Payer: Self-pay

## 2018-05-14 DIAGNOSIS — K0889 Other specified disorders of teeth and supporting structures: Secondary | ICD-10-CM

## 2018-05-14 DIAGNOSIS — Z3A24 24 weeks gestation of pregnancy: Secondary | ICD-10-CM | POA: Diagnosis not present

## 2018-05-14 DIAGNOSIS — O162 Unspecified maternal hypertension, second trimester: Secondary | ICD-10-CM | POA: Diagnosis not present

## 2018-05-14 DIAGNOSIS — R0989 Other specified symptoms and signs involving the circulatory and respiratory systems: Secondary | ICD-10-CM

## 2018-05-14 DIAGNOSIS — F17210 Nicotine dependence, cigarettes, uncomplicated: Secondary | ICD-10-CM | POA: Diagnosis not present

## 2018-05-14 LAB — GROUP A STREP BY PCR: Group A Strep by PCR: NOT DETECTED

## 2018-05-14 NOTE — ED Notes (Signed)
Attempted fht.  Unable to locate. Patient does feel movement.

## 2018-05-14 NOTE — ED Notes (Signed)
Patient says she took 500 mg tylenol yesterday for toothache.  Says she noticed that it did not go down later.  At 2am took 2 more, but the first was still there.  Has eaten and still feels it, and says she can stick finger down throat and still feel the pill.  Patient sitting in chair in no distress.  Respirations are even and non labored.

## 2018-05-14 NOTE — Discharge Instructions (Addendum)
Initially today, your blood pressure was elevated.  Please have your OB/GYN or your primary care doctor recheck your blood pressure in the next 2 to 3 days.  You may continue to eat and drink as tolerated.  If you continue to have the sensation of a foreign body in your throat, please make an appointment to follow-up with the gastrointestinal doctors.  Return to the emergency department if you develop severe pain, inability to keep down fluids, drooling, shortness of breath, or any other symptoms concerning to you.

## 2018-05-14 NOTE — ED Provider Notes (Addendum)
[facility] Emergency Department Provider Note  ____________________________________________  Time seen: Approximately 10:31 AM  I have reviewed the triage vital signs and the nursing notes.   HISTORY  Chief Complaint Foreign Body    HPI Karen Wallace is a 33 y.o. female [redacted] weeks pregnant presenting with foreign body sensation in the throat.  The patient reports that for the past 2 weeks she has had a "scratchy throat," without any congestion or rhinorrhea, fever or chills, ear pain, or cough. In addition, she has been having tooth pain and has a dentist appointment on Monday.  For her tooth pain, last night at midnight, she took 2 500 mg tablets of Tylenol with water.  She did not have any immediate abnormal sensations, but when she laid down, she had some coughing and a foreign body sensation.  She states that she stuck her finger down her throat and thinks she was able to feel the Tylenol tablet.  She proceeded to eat some avocado, without any difficulty.  This morning she had a full breakfast and has been tolerating liquids without any problems.  She is not having any drooling, stridor or shortness of breath.  However, she continues to have a sensation of foreign body in the center part of her neck.  She denies any complaints with the pregnancy including abdominal pain, vaginal bleeding; she reports normal fetal movement  Past Medical History:  Diagnosis Date  . Abnormal Pap smear of cervix    2006/2010  . Anxiety   . Depression   . 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 GERD (gastroesophageal reflux disease)    OTC GUMMIES PRN  . Heart murmur    AS A  CHILD  . Hypertension   . Indication for care in labor and delivery, antepartum 05/23/2015  . Postpartum care following vaginal delivery 05/25/2015  . Preterm contractions 04/11/2015    Patient Active Problem List   Diagnosis Date Noted  . Trichomonas vaginalis infection 04/06/2018  .  Chronic hypertension affecting pregnancy 03/30/2018  . Chronic post-traumatic stress disorder (PTSD) 03/30/2018  . Genital herpes affecting pregnancy 03/30/2018  . Supervision of high risk pregnancy, antepartum 03/30/2018    Past Surgical History:  Procedure Laterality Date  . APPENDECTOMY  april 2016  . CHOLECYSTECTOMY N/A 11/19/2016   Procedure: LAPAROSCOPIC CHOLECYSTECTOMY;  Surgeon: Henrene Dodge, MD;  Location: [facility] ORS;  Service: General;  Laterality: N/A;  . colposcopy of cervix     2007/2010  . DILATION AND CURETTAGE OF UTERUS      Current Outpatient Rx  . Order #: [id number] Class: Historical Med  . Order #: [id number] Class: Normal  . Order #: [id number] Class: Historical Med  . Order #: [id number] Class: Historical Med  . Order #: [id number] Class: Historical Med  . Order #: [id number] Class: Normal    Allergies Lisinopril  Family History  Problem Relation Age of Onset  . Hypertension Mother   . Depression Mother   . Hypertension Father     Social History Social History   Tobacco Use  . Smoking status: Current Every Day Smoker    Packs/day: 0.25    Years: 9.00    Pack years: 2.25    Types: Cigarettes  . Smokeless tobacco: Never Used  Substance Use Topics  . Alcohol use: Not Currently    Comment: OCC  . Drug use: No    Review of Systems Constitutional: No fever/chills.  Lightheadedness or syncope. Eyes: No visual changes. ENT: +2 weeks of  sore throat. No congestion or rhinorrhea.  No ear pain.  Positive foreign body sensation in the center of the neck. Cardiovascular: Denies chest pain. Denies palpitations. Respiratory: Denies shortness of breath.  Positive cough. Gastrointestinal: No abdominal pain.  No nausea, no vomiting.  No diarrhea.  No constipation. Genitourinary: Negative for dysuria.  No vaginal bleeding.  Normal fetal movement. Musculoskeletal: Negative for back pain. Skin: Negative for rash. Neurological: Negative for headaches. No focal numbness,  tingling or weakness.     ____________________________________________   PHYSICAL EXAM:  VITAL SIGNS: ED Triage Vitals  Enc Vitals Group     BP 05/14/18 0942 (!) 188/89     Pulse Rate 05/14/18 0942 (!) 125     Resp 05/14/18 0942 18     Temp 05/14/18 0942 98.4 F (36.9 C)     Temp Source 05/14/18 0942 Oral     SpO2 05/14/18 0942 98 %     Weight 05/14/18 0939 213 lb (96.6 kg)     Height 05/14/18 0939 5\' 8"  (1.727 m)     Head Circumference --      Peak Flow --      Pain Score 05/14/18 0938 8     Pain Loc --      Pain Edu? --      Excl. in GC? --     Constitutional: Alert and oriented.  Answers questions appropriately. Eyes: Conjunctivae are normal.  EOMI. No scleral icterus. Head: Atraumatic.  No swelling of the face. Nose: No congestion/rhinnorhea. Mouth/Throat: Mucous membranes are moist.  No posterior pharyngeal erythema.  The patient does have exudate on the left tonsil with minimal tonsillar swelling.  The posterior palate is symmetric and the uvula is midline.  No drooling, trismus, or stridor.  The patient has no evidence of acute dental abscess.  No evidence of foreign body. Neck: No stridor.  Supple.   Cardiovascular: Normal rate, regular rhythm. No murmurs, rubs or gallops.  Respiratory: Normal respiratory effort.  No accessory muscle use or retractions. Lungs CTAB.  No wheezes, rales or ronchi. Gastrointestinal: Soft, nontender and nondistended.  Gravid to several centimeters above the umbilicus.  No guarding or rebound.  No peritoneal signs. Musculoskeletal: No LE edema. . Neurologic:  A&Ox3.  Speech is clear.  Face and smile are symmetric.  EOMI.  Moves all extremities well. Skin:  Skin is warm, dry and intact. No rash noted. Psychiatric: Mood and affect are normal. Speech and behavior are normal.  Normal judgement.  ____________________________________________   LABS (all labs ordered are listed, but only abnormal results are displayed)  Labs Reviewed   GROUP A STREP BY PCR   ____________________________________________  EKG  ED ECG REPORT I, Anne-Caroline Sharma CovertNorman, the attending physician, personally viewed and interpreted this ECG.   Date: 05/14/2018  EKG Time: 1047  Rate: 100  Rhythm: sinus rhythm, sinus tachycardia  Axis: normal  Intervals:none  ST&T Change: NO STEMI  ____________________________________________  RADIOLOGY  Dg Neck Soft Tissue  Result Date: 05/14/2018 CLINICAL DATA:  Patient feels like a pill is stuck in the throat. EXAM: NECK SOFT TISSUES - 1+ VIEW COMPARISON:  None. FINDINGS: There is no evidence of retropharyngeal soft tissue swelling or epiglottic enlargement. The cervical airway is unremarkable and no radio-opaque foreign body identified. IMPRESSION: No radiopaque foreign bodies identified.  The airway is patent. Electronically Signed   By: Sherian ReinWei-Chen  Lin M.D.   On: 05/14/2018 11:09    ____________________________________________   PROCEDURES  Procedure(s) performed: None  Procedures  Critical  Care performed: No ____________________________________________   INITIAL IMPRESSION / ASSESSMENT AND PLAN / ED COURSE  Pertinent labs & imaging results that were available during my care of the patient were reviewed by me and considered in my medical decision making (see chart for details).  33 y.o. female 5724 weeks pregnant presenting with foreign body sensation after taking 2 tablets of Tylenol at midnight last night.  Overall, the patient has no evidence of airway compromise today.  I do not see any foreign body and suspect that it is unlikely she has ongoing foreign body.  A scratch or abrasion with continued foreign body sensation is possible.  We will get a soft tissue neck for further evaluation.  I have done a bedside swallow examination myself, and she was able to tolerate large quantities of water without any difficulty, coughing or sputtering, or vomiting.  A pulmonary foreign body is very  unlikely.  Given the patient's scratchy throat for the last several weeks, will get a strep test, as this may also be contributing to the abnormal sensation he has in her neck.  I have recommended that if her work-up in the emergency department is reassuring, that she monitors her symptoms over the weekend and contact GI on Monday if she continues to have a foreign body sensation.  I have given her follow-up instructions as well as return precautions.  ----------------------------------------- 11:48 AM on 05/14/2018 -----------------------------------------  The patient's neck x-ray does not show any foreign body.  Her strep testing is negative.  At this time, we will plan to discharge the patient home.  I have been given her expectant management as well as follow-up instructions.  Her repeat vital signs have significantly improved but I have asked her to make sure to follow-up her blood pressure with her primary care physician or her OB/GYN.    I personally performed fetal heart tones which were in the 130s.  ____________________________________________  FINAL CLINICAL IMPRESSION(S) / ED DIAGNOSES  Final diagnoses:  Foreign body sensation in throat  Toothache  Elevated blood pressure affecting pregnancy in second trimester, antepartum         NEW MEDICATIONS STARTED DURING THIS VISIT:  New Prescriptions   No medications on file      Rockne MenghiniNorman, Anne-Caroline, MD 05/14/18 1149    Rockne MenghiniNorman, Anne-Caroline, MD 05/14/18 1158    Rockne MenghiniNorman, Anne-Caroline, MD 05/14/18 1214

## 2018-05-14 NOTE — ED Triage Notes (Signed)
Pt reports took tylenol last night and felt like one got stuck in throat.  Was able to take more tylenol later and eat and that food was able to pass.  Pt is 4 months pregnant.  G4P2.  Pt tachy in triage and hypertensive.  Not on medication despite HTN hx because reports BP has been under control.  Pt still feels like tylenol is stuck.  Pt report can stick finger in back of throat and fell pill.

## 2018-05-17 ENCOUNTER — Ambulatory Visit (INDEPENDENT_AMBULATORY_CARE_PROVIDER_SITE_OTHER): Payer: Medicaid Other | Admitting: Obstetrics and Gynecology

## 2018-05-17 VITALS — BP 140/82 | Wt 216.0 lb

## 2018-05-17 DIAGNOSIS — Z363 Encounter for antenatal screening for malformations: Secondary | ICD-10-CM

## 2018-05-17 DIAGNOSIS — O10919 Unspecified pre-existing hypertension complicating pregnancy, unspecified trimester: Secondary | ICD-10-CM

## 2018-05-17 DIAGNOSIS — Z3A15 15 weeks gestation of pregnancy: Secondary | ICD-10-CM

## 2018-05-17 DIAGNOSIS — O10912 Unspecified pre-existing hypertension complicating pregnancy, second trimester: Secondary | ICD-10-CM

## 2018-05-17 DIAGNOSIS — O0990 Supervision of high risk pregnancy, unspecified, unspecified trimester: Secondary | ICD-10-CM

## 2018-05-17 DIAGNOSIS — O0992 Supervision of high risk pregnancy, unspecified, second trimester: Secondary | ICD-10-CM

## 2018-05-17 MED ORDER — PROCHLORPERAZINE MALEATE 10 MG PO TABS: 10.0000 mg | ORAL_TABLET | Freq: Four times a day (QID) | ORAL | 3 refills | Status: DC | PRN

## 2018-05-17 NOTE — Patient Instructions (Signed)
Start low dose ASA at >[redacted] weeks gestation as per USPTF recommendation "Low-Dose Aspirin Use for the Prevention of Morbidity and Mortality From Preeclampsia: Preventive Medicine"  furthermore endorsed by ACOG, WHO, and NIH based on evidence level B for the prevention of preeclampsia  In women deemed high risk  (diabetes, renal disease, chronic hypertension, history of preeclampsia in prior gestation, autoimmune diseases, or multifetal gestations).  ACOG Committee Opinion 743 "Low-Dose Asprin Use During Pregnancy" June 25th 2018  High Risk (Start if 1 or more present) History of preeclampsia Multifetal Gestation Chronic HTN Type I or II DM Renal Disease Autoimmune Disease (SLE, Antiphospholipid antibody syndrome)  Moderate Risk (consider starting if more than one present) Nulliparity Obesity (BMI >30) Family history of Preeclampsia (Mother or sister) Socioeconomic characteristics (African American, low socieeconomic status) Age 35 years or older Personal history factors (low birthweight of SGA, previous adverse pregnancy outcome, more than 10 year pregnancy interval)  

## 2018-05-17 NOTE — Progress Notes (Signed)
ROB Headache

## 2018-05-17 NOTE — Progress Notes (Signed)
    Routine Prenatal Care Visit  Subjective  Karen Wallace is a 33 y.o. [phone] at [redacted]w[redacted]d being seen today for ongoing prenatal care.  She is currently monitored for the following issues for this high-risk pregnancy and has Chronic hypertension affecting pregnancy; Chronic post-traumatic stress disorder (PTSD); Genital herpes affecting pregnancy; Supervision of high risk pregnancy, antepartum; and Trichomonas vaginalis infection on their problem list.  ----------------------------------------------------------------------------------- Patient reports headache.   Contractions: Not present. Vag. Bleeding: None.  Movement: Present. Denies leaking of fluid.  ----------------------------------------------------------------------------------- The following portions of the patient's history were reviewed and updated as appropriate: allergies, current medications, past family history, past medical history, past social history, past surgical history and problem list. Problem list updated.   Objective  Blood pressure 140/82, weight 216 lb (98 kg), last menstrual period 01/20/2018. Body mass index is 32.84 kg/m.  Pregravid weight 215 lb (97.5 kg) Total Weight Gain 1 lb (0.454 kg) Urinalysis:      Fetal Status: Fetal Heart Rate (bpm): 145   Movement: Present     General:  Alert, oriented and cooperative. Patient is in no acute distress.  Skin: Skin is warm and dry. No rash noted.   Cardiovascular: Normal heart rate noted  Respiratory: Normal respiratory effort, no problems with respiration noted  Abdomen: Soft, gravid, appropriate for gestational age. Pain/Pressure: Absent     Pelvic:  Cervical exam deferred        Extremities: Normal range of motion.     ental Status: Normal mood and affect. Normal behavior. Normal judgment and thought content.     Assessment   33 y.o. [id number] at [redacted]w[redacted]d by  11/07/2018, by Ultrasound presenting for routine prenatal visit  Plan   pregnancy Problems (from  03/29/18 to present)    Problem Noted Resolved   Trichomonas vaginalis infection 04/06/2018 by Tresea Mall, CNM No   Chronic hypertension affecting pregnancy 03/30/2018 by Tresea Mall, CNM No   Chronic post-traumatic stress disorder (PTSD) 03/30/2018 by Tresea Mall, CNM No   Genital herpes affecting pregnancy 03/30/2018 by Tresea Mall, CNM No       Gestational age appropriate obstetric precautions including but not limited to vaginal bleeding, contractions, leaking of fluid and fetal movement were reviewed in detail with the patient.    - Rx compazine for headaches, discussed OTC options - Start low dose ASA at >[redacted] weeks gestation as per USPTF recommendation "Low-Dose Aspirin Use for the Prevention of Morbidity and Mortality From Preeclampsia: Preventive Medicine"  furthermore endorsed by ACOG, WHO, and NIH based on evidence level B for the prevention of preeclampsia  In women deemed high risk  (diabetes, renal disease, chronic hypertension, history of preeclampsia in prior gestation, autoimmune diseases, or multifetal gestations).  ACOG Committee Opinion 743 "Low-Dose Asprin Use During Pregnancy" June 25th 2018  High Risk (Start if 1 or more present) History of preeclampsia Multifetal Gestation Chronic HTN Type I or II DM Renal Disease Autoimmune Disease (SLE, Antiphospholipid antibody syndrome)  Moderate Risk (consider starting if more than one present) Nulliparity Obesity (BMI >30) Family history of Preeclampsia (Mother or sister) Socioeconomic characteristics (African American, low socieeconomic status) Age 62 years or older Personal history factors (low birthweight of SGA, previous adverse pregnancy outcome, more than 10 year pregnancy interval)  - CMP and P/C ratio today  Return in about 4 weeks (around 06/14/2018) for ROB and anatomy scan.  Vena Austria, MD, [facility], [facility] 05/17/2018, 2:31 PM

## 2018-05-18 LAB — COMPREHENSIVE METABOLIC PANEL
A/G RATIO: 1.3 (ref 1.2–2.2)
ALT: 25 IU/L (ref 0–32)
AST: 25 IU/L (ref 0–40)
Albumin: 3.9 g/dL (ref 3.8–4.8)
Alkaline Phosphatase: 76 IU/L (ref 39–117)
BUN/Creatinine Ratio: 21 (ref 9–23)
BUN: 11 mg/dL (ref 6–20)
Bilirubin Total: 0.2 mg/dL (ref 0.0–1.2)
CO2: 21 mmol/L (ref 20–29)
Calcium: 9.6 mg/dL (ref 8.7–10.2)
Chloride: 100 mmol/L (ref 96–106)
Creatinine, Ser: 0.52 mg/dL — ABNORMAL LOW (ref 0.57–1.00)
GFR calc Af Amer: 146 mL/min/{1.73_m2} (ref >59)
GFR calc non Af Amer: 127 mL/min/{1.73_m2} (ref >59)
Globulin, Total: 2.9 g/dL (ref 1.5–4.5)
Glucose: 90 mg/dL (ref 65–99)
Potassium: 4 mmol/L (ref 3.5–5.2)
Sodium: 138 mmol/L (ref 134–144)
Total Protein: 6.8 g/dL (ref 6.0–8.5)

## 2018-05-18 LAB — PROTEIN / CREATININE RATIO, URINE
Creatinine, Urine: 104.6 mg/dL
Protein, Ur: 20.6 mg/dL
Protein/Creat Ratio: 197 mg/g creat (ref 0–200)

## 2018-06-09 ENCOUNTER — Ambulatory Visit (INDEPENDENT_AMBULATORY_CARE_PROVIDER_SITE_OTHER): Payer: Medicaid Other | Admitting: Obstetrics & Gynecology

## 2018-06-09 VITALS — BP 120/80 | Wt 216.0 lb

## 2018-06-09 DIAGNOSIS — Z716 Tobacco abuse counseling: Secondary | ICD-10-CM

## 2018-06-09 DIAGNOSIS — R1031 Right lower quadrant pain: Secondary | ICD-10-CM

## 2018-06-09 DIAGNOSIS — B009 Herpesviral infection, unspecified: Secondary | ICD-10-CM

## 2018-06-09 DIAGNOSIS — O98512 Other viral diseases complicating pregnancy, second trimester: Secondary | ICD-10-CM

## 2018-06-09 HISTORY — DX: Herpesviral infection, unspecified: B00.9

## 2018-06-09 HISTORY — DX: Right lower quadrant pain: R10.31

## 2018-06-09 MED ORDER — NICOTINE 7 MG/24HR TD PT24: 7.0000 mg | MEDICATED_PATCH | Freq: Every day | TRANSDERMAL | 1 refills | Status: DC

## 2018-06-09 MED ORDER — VALACYCLOVIR HCL 500 MG PO TABS: 500.0000 mg | ORAL_TABLET | Freq: Every day | ORAL | 5 refills | Status: DC

## 2018-06-09 NOTE — Progress Notes (Signed)
Gynecology Pelvic Pain Evaluation   Chief Complaint:  Chief Complaint  Patient presents with  . Pelvic Pain    History of Present Illness:   Patient is a 33 y.o. [id number] who LMP was Patient's last menstrual period was 01/20/2018., presents today for a problem visit.  She complains of pain.   Her pain is localized to the RLQ and right side area, described as intermittent and sharp, began several days ago and its severity is described as moderate. The pain radiates to the  Non-radiating. She has these associated symptoms which include fatigue. Patient has these modifiers which include relaxation that make it better and unable to associate with any factor that make it worse.  Context includes: pregnant (approximately [redacted] weeks pregnant).  Previous studies include [place], prior ovarian cyst.  Prior appy, chole. Prior NSVD x2  PMHx: She  has a past medical history of Abnormal Pap smear of cervix, Anxiety, Depression, Genital herpes (09/2017), GERD (gastroesophageal reflux disease), Heart murmur, Hypertension, Indication for care in labor and delivery, antepartum (05/23/2015), Postpartum care following vaginal delivery (05/25/2015), and Preterm contractions (04/11/2015). Also,  has a past surgical history that includes Appendectomy (april 2016); Dilation and curettage of uterus; colposcopy of cervix; and Cholecystectomy (N/A, 11/19/2016)., family history includes Depression in her mother; Hypertension in her father and mother.,  reports that she has been smoking cigarettes. She has a 2.25 pack-year smoking history. She has never used smokeless tobacco. She reports previous alcohol use. She reports that she does not use drugs.  She has a current medication list which includes the following prescription(s): buspirone, cetirizine, nifedipine, nifedipine, provida ob, prochlorperazine, valacyclovir, and nicotine. Also, is allergic to lisinopril.  Review of Systems  Constitutional: Negative for chills, fever and  malaise/fatigue.  HENT: Negative for congestion, sinus pain and sore throat.   Eyes: Negative for blurred vision and pain.  Respiratory: Negative for cough and wheezing.   Cardiovascular: Negative for chest pain and leg swelling.  Gastrointestinal: Negative for abdominal pain, constipation, diarrhea, heartburn, nausea and vomiting.  Genitourinary: Negative for dysuria, frequency, hematuria and urgency.  Musculoskeletal: Negative for back pain, joint pain, myalgias and neck pain.  Skin: Negative for itching and rash.  Neurological: Negative for dizziness, tremors and weakness.  Endo/Heme/Allergies: Does not bruise/bleed easily.  Psychiatric/Behavioral: Negative for depression. The patient is not nervous/anxious and does not have insomnia.    Objective: BP 120/80   Wt 216 lb (98 kg)   LMP 01/20/2018   BMI 32.84 kg/m  Physical Exam Constitutional:      General: She is not in acute distress.    Appearance: She is well-developed.  Abdominal:     Comments: Gravid, ND, no rebound FHT 140s Scars from appy, chole seen  Musculoskeletal: Normal range of motion.  Neurological:     Mental Status: She is alert and oriented to person, place, and time.  Skin:    General: Skin is warm and dry.  Vitals signs reviewed.   Assessment: 33 y.o. [id number] with Pregnancy: round ligament pain and cyst pain and other etiology to consider.  There are no diagnoses linked to this encounter. Problem List Items Addressed This Visit      Other   RLQ abdominal pain - Primary   Tobacco abuse counseling   HSV-2 infection complicating pregnancy, second trimester   Relevant Medications   valACYclovir (VALTREX) 500 MG tablet    Pain form multiple sources possible.  [place] scheduled for Monday.  Round ligament pain discussed.  Suppress HSV  as has had several outbreaks since pregnancy.  Nicotrol for 2 weeks for smoking cessation  Tylenol, heat, rest  A total of 15 minutes were spent face-to-face with the  patient during this encounter and over half of that time dealt with counseling and coordination of care.  Annamarie Major, MD, [facility], [facility] 06/09/2018  4:00 PM

## 2018-06-14 ENCOUNTER — Ambulatory Visit (INDEPENDENT_AMBULATORY_CARE_PROVIDER_SITE_OTHER): Payer: Medicaid Other

## 2018-06-14 ENCOUNTER — Ambulatory Visit (INDEPENDENT_AMBULATORY_CARE_PROVIDER_SITE_OTHER): Payer: Medicaid Other | Admitting: Obstetrics & Gynecology

## 2018-06-14 VITALS — BP 138/80 | Wt 217.0 lb

## 2018-06-14 DIAGNOSIS — O10912 Unspecified pre-existing hypertension complicating pregnancy, second trimester: Secondary | ICD-10-CM

## 2018-06-14 DIAGNOSIS — Z09 Encounter for follow-up examination after completed treatment for conditions other than malignant neoplasm: Secondary | ICD-10-CM

## 2018-06-14 DIAGNOSIS — Z3A19 19 weeks gestation of pregnancy: Secondary | ICD-10-CM

## 2018-06-14 DIAGNOSIS — Z363 Encounter for antenatal screening for malformations: Secondary | ICD-10-CM

## 2018-06-14 DIAGNOSIS — O10919 Unspecified pre-existing hypertension complicating pregnancy, unspecified trimester: Secondary | ICD-10-CM

## 2018-06-14 DIAGNOSIS — O0990 Supervision of high risk pregnancy, unspecified, unspecified trimester: Secondary | ICD-10-CM

## 2018-06-14 LAB — POCT URINALYSIS DIPSTICK OB
Glucose, UA: NEGATIVE
PROTEIN: NEGATIVE

## 2018-06-14 NOTE — Patient Instructions (Signed)

## 2018-06-14 NOTE — Progress Notes (Signed)
  Subjective  Fetal Movement? yes Contractions? no Leaking Fluid? no Vaginal Bleeding? no  Objective  BP 138/80   Wt 217 lb (98.4 kg)   LMP 01/20/2018   BMI 32.99 kg/m  General: NAD Pumonary: no increased work of breathing Abdomen: gravid, non-tender Extremities: no edema Psychiatric: mood appropriate, affect full  Assessment  33 y.o. [id number] at [redacted]w[redacted]d by  11/07/2018, by Ultrasound presenting for routine prenatal visit  Plan   Problem List Items Addressed This Visit      Cardiovascular and Mediastinum   Chronic hypertension affecting pregnancy     Other   Supervision of high risk pregnancy, antepartum    Other Visit Diagnoses    Encounter for follow-up    -  Primary   Relevant Orders   US OB Follow Up   [redacted] weeks gestation of pregnancy        Review of ULTRASOUND. I have personally reviewed images and report of recent ultrasound done at [facility]. There is a singleton gestation with subjectively normal amniotic fluid volume. The fetal biometry correlates with established dating. Detailed evaluation of the fetal anatomy was performed.The fetal anatomical survey appears within normal limits within the resolution of ultrasound as described above.  It must be noted that a normal ultrasound is unable to rule out fetal aneuploidy.   FOLLOW UP US needed for placenta, certain fetal structures  No meds for HTN, no s/sx preeclampsia or worsening BP today  Does not take Buspar Has not yet stopped smoking; counseled as to its importance  Annamarie Major, MD, [facility], [facility] 06/14/2018  11:40 AM

## 2018-06-22 ENCOUNTER — Other Ambulatory Visit: Payer: Self-pay | Admitting: Obstetrics & Gynecology

## 2018-07-12 ENCOUNTER — Encounter: Payer: Medicaid Other | Admitting: Maternal Newborn

## 2018-07-12 ENCOUNTER — Ambulatory Visit: Payer: Medicaid Other

## 2018-07-15 ENCOUNTER — Ambulatory Visit (INDEPENDENT_AMBULATORY_CARE_PROVIDER_SITE_OTHER): Payer: Medicaid Other

## 2018-07-15 ENCOUNTER — Ambulatory Visit (INDEPENDENT_AMBULATORY_CARE_PROVIDER_SITE_OTHER): Payer: Medicaid Other | Admitting: Obstetrics and Gynecology

## 2018-07-15 ENCOUNTER — Other Ambulatory Visit: Payer: Self-pay

## 2018-07-15 ENCOUNTER — Encounter: Payer: Self-pay | Admitting: Obstetrics and Gynecology

## 2018-07-15 VITALS — BP 120/78 | Wt 220.0 lb

## 2018-07-15 DIAGNOSIS — F329 Major depressive disorder, single episode, unspecified: Secondary | ICD-10-CM | POA: Insufficient documentation

## 2018-07-15 DIAGNOSIS — O10919 Unspecified pre-existing hypertension complicating pregnancy, unspecified trimester: Secondary | ICD-10-CM

## 2018-07-15 DIAGNOSIS — Z3A23 23 weeks gestation of pregnancy: Secondary | ICD-10-CM

## 2018-07-15 DIAGNOSIS — O0990 Supervision of high risk pregnancy, unspecified, unspecified trimester: Secondary | ICD-10-CM

## 2018-07-15 DIAGNOSIS — O99333 Smoking (tobacco) complicating pregnancy, third trimester: Secondary | ICD-10-CM

## 2018-07-15 DIAGNOSIS — O99330 Smoking (tobacco) complicating pregnancy, unspecified trimester: Secondary | ICD-10-CM | POA: Insufficient documentation

## 2018-07-15 DIAGNOSIS — F17210 Nicotine dependence, cigarettes, uncomplicated: Secondary | ICD-10-CM

## 2018-07-15 DIAGNOSIS — O10912 Unspecified pre-existing hypertension complicating pregnancy, second trimester: Secondary | ICD-10-CM

## 2018-07-15 DIAGNOSIS — F32A Depression, unspecified: Secondary | ICD-10-CM | POA: Insufficient documentation

## 2018-07-15 DIAGNOSIS — F419 Anxiety disorder, unspecified: Secondary | ICD-10-CM | POA: Insufficient documentation

## 2018-07-15 DIAGNOSIS — Z362 Encounter for other antenatal screening follow-up: Secondary | ICD-10-CM

## 2018-07-15 DIAGNOSIS — O99342 Other mental disorders complicating pregnancy, second trimester: Secondary | ICD-10-CM

## 2018-07-15 HISTORY — DX: Smoking (tobacco) complicating pregnancy, unspecified trimester: O99.330

## 2018-07-15 MED ORDER — NICOTINE POLACRILEX 2 MG MT GUM: 2.0000 mg | CHEWING_GUM | OROMUCOSAL | 0 refills | Status: DC | PRN

## 2018-07-15 MED ORDER — ASPIRIN EC 81 MG PO TBEC: 81.0000 mg | DELAYED_RELEASE_TABLET | Freq: Every day | ORAL | 2 refills | Status: DC

## 2018-07-15 NOTE — Progress Notes (Signed)
ROB/US C/o some pains hear and there, pelvic pain in the morning or if she has been sitting a while. no vb, no lof, Good FM Wants to have a note to be out of work because she works at [employer]

## 2018-07-15 NOTE — Patient Instructions (Signed)
 Hello,  Given the current COVID-19 pandemic, our practice is making changes in how we are providing care to our patients. We are limiting in-person visits for the safety of all of our patients.   As a practice, we have met to discuss the best way to minimize visits, but still provide excellent care to our expecting mothers.  We have decided on the following visit structure for low-risk pregnancies.  Initial Pregnancy visit will be conducted as a telephone or web visit.  Between 10-14 weeks  there will be one in-person visit for an ultrasound, lab work, and genetic screening. 20 weeks in-person visit with an anatomy ultrasound  28 weeks in-person office visit for a 1-hour glucose test and a TDAP vaccination 32 weeks in-person office visit 34 weeks telephone visit 36 weeks in-person office visit 38 weeks in-person office visit 40 weeks in-person office visit  Understandably, some patients will require more visits than what is outlined above. Additional visits will be determined on a case-by-case basis.   We will, as always, be available for emergencies or to address concerns that might arise between in-person visits. We ask that you allow us the opportunity to address any concerns over the phone or through a virtual visit first. We will be available to return your phone calls throughout the day.   If you are able to purchase a scale, a blood pressure machine, and a home fetal doppler visits could be limited further. This will help decrease your exposure risks, but these purchases are not a necessity.   Things seem to change daily and there is the possibility that this structure could change, please be patient as we adapt to a new way of caring for patients.   Thank you for trusting us with your prenatal care. Our practice values you and looks forward to providing you with excellent care.   Sincerely,   [facility], [facility]    COVID-19 and Your Pregnancy FAQ   How can I prevent infection with COVID-19 during my pregnancy? Social distancing is key. Please limit any interactions in public. Try and work from home if possible. Frequently wash your hands after touching possibly contaminated surfaces. Avoid touching your face.  Minimize trips to the store. Consider online ordering when possible.   Should I wear a mask? Masks should only be worn by those experiencing symptoms of COVID-19 or those with confirmed COVID-19 when they are in public or around other individuals.  What are the symptoms of COVID-19? Fever (greater than 100.4 F), dry cough, shortness of breath.  Am I more at risk for COVID-19 since I am pregnant? There is not currently data showing that pregnant women are more adversely impacted by COVID-19 than the general population. However, we know that pregnant women tend to have worse respiratory complications from similar diseases such as the flu and SARS and for this reason should be considered an at-risk population.  What do I do if I am experiencing the symptoms of COVID-19? Testing is being limited because of test availability. If you are experiencing symptoms you should quarantine yourself, and the members of your family, for at least 2 weeks at home.   Please visit this website for more information: https://www.cdc.gov/coronavirus/2019-ncov/if-you-are-sick/steps-when-sick.html  When should I go to the Emergency Room? Please go to the emergency room if you are experiencing ANY of these symptoms*:  1.    Difficulty breathing or shortness of breath 2.    Persistent pain or pressure in the chest   3.    Confusion or difficulty being aroused (or awakened) 4.    Bluish lips or face  *This list is not all inclusive. Please consult our office for any other symptoms that are severe or concerning.  What do I do if I am having difficulty breathing? You should go to the Emergency Room for evaluation. At this time they have a tent set up for  evaluating patients with COVID-19 symptoms.   How will my prenatal care be different because of the COVID-19 pandemic? It has been recommended to reduce the frequency of face-to-face visits and use resources such as telephone and virtual visits when possible. Using a scale, blood pressure machine and fetal doppler at home can further help reduce face-to-face visits. You will be provided with additional information on this topic.  We ask that you come to your visits alone to minimize potential exposures to  COVID-19.  How can I receive childbirth education? At this time in-person classes have been cancelled. You can register for online childbirth education, breastfeeding, and newborn care classes.  Please visit:  [email or web address] for more information  How will my hospital birth experience be different? The hospital is currently limiting visitors. This means that while you are in labor you can only have one person at the hospital with you. Additional family members will not be allowed to wait in the building or outside your room. Your one support person can be the father of the baby, a relative, a doula, or a friend. Once one support person is designated that person will wear a band. This band cannot be shared with multiple people.  How long will I stay in the hospital for after giving birth? It is also recommended that discharge home be expedited during the COVID-19 outbreak. This means staying for 1 day after a vaginal delivery and 2 days after a cesarean section.  What if I have COVID-19 and I am in labor? We ask that you wear a mask while on labor and delivery. We will try and accommodate you being placed in a room that is capable of filtering the air. Please call ahead if you are in labor and on your way to the hospital. The phone number for labor and delivery at [facility] is [phone].  If I have COVID-19 when my baby is born how can I prevent my baby  from contracting COVID-19? This is an issue that will have to be discussed on a case-by-case basis. Current recommendations suggest providing separate isolation rooms for both the mother and new infant as well as limiting visitors. However, there are practical challenges to this recommendation. The situation will assuredly change and decisions will be influenced by the desires of the mother and availability of space.  Some suggestions are the use of a curtain or physical barrier between mom and infant, hand hygiene, mom wearing a mask, or 6 feet of spacing between a mom and infant.   Can I breastfeed during the COVID-19 pandemic?   Yes, breastfeeding is encouraged.  Can I breastfeed if I have COVID-19? Yes. Covid-19 has not been found in breast milk. This means you cannot give COVID-19 to your child through breast milk. Breast feeding will also help pass antibodies to fight infection to your baby.   What precautions should I take when breastfeeding if I have COVID-19? If a mother and newborn do room-in and the mother wishes to feed at the breast, she should put on a facemask and practice hand   hygiene before each feeding.  What precautions should I take when pumping if I have COVID-19? Prior to expressing breast milk, mothers should practice hand hygiene. After each pumping session, all parts that come into contact with breast milk should be thoroughly washed and the entire pump should be appropriately disinfected per the manufacturer's instructions. This expressed breast milk should be fed to the newborn by a healthy caregiver.  What if I am pregnant and work in healthcare? Based on limited data regarding COVID-19 and pregnancy, ACOG currently does not propose creating additional restrictions on pregnant health care personnel because of COVID-19 alone. Pregnant women do not appear to be at higher risk of severe disease related to COVID-19. Pregnant health care personnel should follow CDC risk  assessment and infection control guidelines for health care personnel exposed to patients with suspected or confirmed COVID-19. Adherence to recommended infection prevention and control practices is an important part of protecting all health care personnel in health care settings.    Information on COVID-19 in pregnancy is very limited; however, facilities may want to consider limiting exposure of pregnant health care personnel to patients with confirmed or suspected COVID-19 infection, especially during higher-risk procedures (eg, aerosol-generating procedures), if feasible, based on staffing availability.

## 2018-07-15 NOTE — Progress Notes (Signed)
aspi    Routine Prenatal Care Visit  Subjective  Karen Wallace is a 33 y.o. [phone] at [redacted]w[redacted]d being seen today for ongoing prenatal care.  She is currently monitored for the following issues for this high-risk pregnancy and has Chronic hypertension affecting pregnancy; Chronic post-traumatic stress disorder (PTSD); Genital herpes affecting pregnancy; Supervision of high risk pregnancy, antepartum; Trichomonas vaginalis infection; RLQ abdominal pain; Tobacco abuse counseling; HSV-2 infection complicating pregnancy, second trimester; Depression; Anxiety; and Tobacco use during pregnancy on their problem list.  ----------------------------------------------------------------------------------- Patient reports pain with bending over and anxiety surrounding COVID-19 outbreak.   Contractions: Not present. Vag. Bleeding: None.  Movement: Present. Denies leaking of fluid.  ----------------------------------------------------------------------------------- The following portions of the patient's history were reviewed and updated as appropriate: allergies, current medications, past family history, past medical history, past social history, past surgical history and problem list. Problem list updated.   Objective  Blood pressure 120/78, weight 220 lb (99.8 kg), last menstrual period 01/20/2018. Pregravid weight 215 lb (97.5 kg) Total Weight Gain 5 lb (2.268 kg) Urinalysis:      Fetal Status: Fetal Heart Rate (bpm): 150 Fundal Height: 24 cm Movement: Present  Presentation: Transverse  General:  Alert, oriented and cooperative. Patient is in no acute distress.  Skin: Skin is warm and dry. No rash noted.   Cardiovascular: Normal heart rate noted  Respiratory: Normal respiratory effort, no problems with respiration noted  Abdomen: Soft, gravid, appropriate for gestational age. Pain/Pressure: Present     Pelvic:  Cervical exam deferred        Extremities: Normal range of motion.  Edema: None  Mental  Status: Normal mood and affect. Normal behavior. Normal judgment and thought content.     Assessment   33 y.o. [id number] at [redacted]w[redacted]d by  11/07/2018, by Ultrasound presenting for routine prenatal visit  Plan   pregnancy Problems (from 03/29/18 to present)    Problem Noted Resolved   Tobacco use during pregnancy 07/15/2018 by Natale Milch, MD No   HSV-2 infection complicating pregnancy, second trimester 06/09/2018 by Nadara Mustard, MD No   Trichomonas vaginalis infection 04/06/2018 by Tresea Mall, CNM No   Chronic hypertension affecting pregnancy 03/30/2018 by Tresea Mall, CNM No   Chronic post-traumatic stress disorder (PTSD) 03/30/2018 by Tresea Mall, CNM No   Genital herpes affecting pregnancy 03/30/2018 by Tresea Mall, CNM No   Supervision of high risk pregnancy, antepartum 03/30/2018 by Tresea Mall, CNM No   Overview Addendum 07/15/2018 12:35 PM by Natale Milch, MD    Clinic [facility] Prenatal Labs  Dating  8 wk [place] Blood type: O/Positive/-- (12/30 1157)   Genetic Screen   NIPS: normal XX Antibody:Negative (12/30 1157)  Anatomic [place] complete Rubella: 1.80 (12/30 1157) Varicella: Immune  GTT Early:   93  Third trimester:  RPR: Non Reactive (12/30 1157)   Rhogam  not needed HBsAg: Negative (12/30 1157)   TDaP vaccine                       Flu Shot: HIV: Non Reactive (12/30 1157)   Baby Food  Breast                              GBS:   Contraception  Pap: NIL 2018  CBB     CS/VBAC NA   Support Person Casimiro Needle              Gestational  age appropriate obstetric precautions including but not limited to vaginal bleeding, contractions, leaking of fluid and fetal movement were reviewed in detail with the patient.    She would like to stop smoking. She is currently smoking 4-5 cigarettes a day when stressed or after meals. She did not start the nicotine patch before. She says she would like to stop smoking. I provided her with a pamphlet and smoking hotline  information. We discussed nicotine gum vs patch and she elected to try nicotine gum.   Provided with COVID-19 work note.  Discussed that she does not have a disability and can not be taken out of work because of the COVID-19 pandemic.  Recommended starting ASA 81mg  daily to lower risk of Pre-eclampsia.  She is not currently taking buspar or BP medication. Anxiety and depression screening scanned.   Return in about 5 weeks (around 08/19/2018) for 1GTT and ROB in person.  Natale Milch MD [facility], [facility] 07/15/2018, 12:34 PM

## 2018-07-29 ENCOUNTER — Ambulatory Visit (INDEPENDENT_AMBULATORY_CARE_PROVIDER_SITE_OTHER): Payer: Medicaid Other | Admitting: Certified Nurse Midwife

## 2018-07-29 ENCOUNTER — Telehealth: Payer: Self-pay

## 2018-07-29 VITALS — BP 110/58 | Wt 224.0 lb

## 2018-07-29 DIAGNOSIS — R1030 Lower abdominal pain, unspecified: Secondary | ICD-10-CM

## 2018-07-29 DIAGNOSIS — O0990 Supervision of high risk pregnancy, unspecified, unspecified trimester: Secondary | ICD-10-CM

## 2018-07-29 DIAGNOSIS — O26892 Other specified pregnancy related conditions, second trimester: Secondary | ICD-10-CM

## 2018-07-29 DIAGNOSIS — Z3A25 25 weeks gestation of pregnancy: Secondary | ICD-10-CM

## 2018-07-29 DIAGNOSIS — R102 Pelvic and perineal pain: Secondary | ICD-10-CM

## 2018-07-29 DIAGNOSIS — O99891 Other specified diseases and conditions complicating pregnancy: Secondary | ICD-10-CM

## 2018-07-29 DIAGNOSIS — O9989 Other specified diseases and conditions complicating pregnancy, childbirth and the puerperium: Secondary | ICD-10-CM

## 2018-07-29 LAB — POCT URINALYSIS DIPSTICK OB
Appearance: ABNORMAL
Bilirubin, UA: NEGATIVE
Blood, UA: NEGATIVE
Glucose, UA: NEGATIVE
Leukocytes, UA: NEGATIVE
Nitrite, UA: NEGATIVE
Odor: NORMAL
Spec Grav, UA: 1.02 (ref 1.010–1.025)
Urobilinogen, UA: 0.2 E.U./dL
pH, UA: 5 (ref 5.0–8.0)

## 2018-07-29 NOTE — Telephone Encounter (Signed)
Pt called c/o since Mon feeling tight all over, sharp pain in lower abd, hurts to walk, decreased FM, pressure.  Not counting time between tightness.  Adv per CLG to come into office now as opposed to 2:30.  Adv may be UTI.

## 2018-07-29 NOTE — Progress Notes (Signed)
Abdominal & Pelvic pain/pressure

## 2018-07-31 LAB — URINE CULTURE

## 2018-07-31 NOTE — Progress Notes (Signed)
Work in appointment at 25wk4d: Complains of pain in lower abdomen and pelvic pressure. Has some uterine tightening when up ambulating and they resolve with rest. Pain in pubic area, mostly when moving. Presents from her work at [employer]. Works 6 hours/day. Just got a maternity support garment and is wearing it today. No bleeding, leakage of fluid, or dysuria. Reported decreased fetal movement earlier this Am, but baby started moving after she presented to her appointment.  Exam: 110/58 General: appears anxious, but otherwise in NAD Abdomen: soft, nontender uterus. MIld tenderness over symphysis pubis. FHTs WNL Cervix: L/T/C/OOP  Results for orders placed or performed in visit on 07/29/18 (from the past 72 hour(s))  POC Urinalysis Dipstick OB     Status: Abnormal   Collection Time: 07/29/18 12:39 PM  Result Value Ref Range   Color, UA dark amber    Clarity, UA cloudy    Glucose, UA Negative Negative   Bilirubin, UA neg    Ketones, UA 3+    Spec Grav, UA 1.020 1.010 - 1.025   Blood, UA neg    pH, UA 5.0 5.0 - 8.0   POC,PROTEIN,UA Trace Negative, Trace, Small (1+), Moderate (2+), Large (3+), 4+   Urobilinogen, UA 0.2 0.2 or 1.0 E.U./dL   Nitrite, UA neg    Leukocytes, UA Negative Negative   Appearance abnormal    Odor normal    A: IUP at 25wk4d with pelvic pain No evidence of preterm labor  P: Discussed etiology of SP pain and treatments including getting a hip stabilizer belt, applying heat or ice to SP or soaking in a tub, taking Tylenol RTO as scheduled for ROB and sooner prn  Farrel Conners, CNM Avoid abducting legs

## 2018-08-19 ENCOUNTER — Other Ambulatory Visit: Payer: Self-pay

## 2018-08-19 ENCOUNTER — Ambulatory Visit (INDEPENDENT_AMBULATORY_CARE_PROVIDER_SITE_OTHER): Payer: Medicaid Other | Admitting: Obstetrics and Gynecology

## 2018-08-19 ENCOUNTER — Other Ambulatory Visit: Payer: Self-pay | Admitting: Obstetrics and Gynecology

## 2018-08-19 ENCOUNTER — Other Ambulatory Visit: Payer: Medicaid Other

## 2018-08-19 ENCOUNTER — Encounter: Payer: Self-pay | Admitting: Obstetrics and Gynecology

## 2018-08-19 VITALS — BP 118/74 | Wt 229.0 lb

## 2018-08-19 DIAGNOSIS — Z131 Encounter for screening for diabetes mellitus: Secondary | ICD-10-CM

## 2018-08-19 DIAGNOSIS — Z3A28 28 weeks gestation of pregnancy: Secondary | ICD-10-CM

## 2018-08-19 DIAGNOSIS — O0990 Supervision of high risk pregnancy, unspecified, unspecified trimester: Secondary | ICD-10-CM

## 2018-08-19 DIAGNOSIS — O98313 Other infections with a predominantly sexual mode of transmission complicating pregnancy, third trimester: Secondary | ICD-10-CM

## 2018-08-19 DIAGNOSIS — F329 Major depressive disorder, single episode, unspecified: Secondary | ICD-10-CM

## 2018-08-19 DIAGNOSIS — A6009 Herpesviral infection of other urogenital tract: Secondary | ICD-10-CM

## 2018-08-19 DIAGNOSIS — O99343 Other mental disorders complicating pregnancy, third trimester: Secondary | ICD-10-CM

## 2018-08-19 DIAGNOSIS — O10919 Unspecified pre-existing hypertension complicating pregnancy, unspecified trimester: Secondary | ICD-10-CM

## 2018-08-19 DIAGNOSIS — O99333 Smoking (tobacco) complicating pregnancy, third trimester: Secondary | ICD-10-CM

## 2018-08-19 DIAGNOSIS — F419 Anxiety disorder, unspecified: Secondary | ICD-10-CM

## 2018-08-19 DIAGNOSIS — Z113 Encounter for screening for infections with a predominantly sexual mode of transmission: Secondary | ICD-10-CM

## 2018-08-19 DIAGNOSIS — O10913 Unspecified pre-existing hypertension complicating pregnancy, third trimester: Secondary | ICD-10-CM

## 2018-08-19 NOTE — Progress Notes (Signed)
Routine Prenatal Care Visit  Subjective  Karen Wallace is a 33 y.o. [phone] at [redacted]w[redacted]d being seen today for ongoing prenatal care.  She is currently monitored for the following issues for this high-risk pregnancy and has Chronic hypertension affecting pregnancy; Chronic post-traumatic stress disorder (PTSD); Genital herpes affecting pregnancy; Supervision of high risk pregnancy, antepartum; Trichomonas vaginalis infection; RLQ abdominal pain; Tobacco abuse counseling; HSV-2 infection complicating pregnancy, second trimester; Depression; Anxiety; and Tobacco use during pregnancy on their problem list.  ----------------------------------------------------------------------------------- Patient reports no complaints.   Contractions: Irritability. Vag. Bleeding: None.  Movement: Present. Denies leaking of fluid.  ----------------------------------------------------------------------------------- The following portions of the patient's history were reviewed and updated as appropriate: allergies, current medications, past family history, past medical history, past social history, past surgical history and problem list. Problem list updated.   Objective  Blood pressure 118/74, weight 229 lb (103.9 kg), last menstrual period 01/20/2018. Pregravid weight 215 lb (97.5 kg) Total Weight Gain 14 lb (6.35 kg) Urinalysis: Urine Protein    Urine Glucose    Fetal Status: Fetal Heart Rate (bpm): 135 Fundal Height: 29 cm Movement: Present     General:  Alert, oriented and cooperative. Patient is in no acute distress.  Skin: Skin is warm and dry. No rash noted.   Cardiovascular: Normal heart rate noted  Respiratory: Normal respiratory effort, no problems with respiration noted  Abdomen: Soft, gravid, appropriate for gestational age. Pain/Pressure: Present     Pelvic:  Cervical exam deferred        Extremities: Normal range of motion.  Edema: None  Mental Status: Normal mood and affect. Normal behavior.  Normal judgment and thought content.   Assessment   33 y.o. [id number] at [redacted]w[redacted]d by  11/07/2018, by Ultrasound presenting for routine prenatal visit  Plan   pregnancy Problems (from 03/29/18 to present)    Problem Noted Resolved   Tobacco use during pregnancy 07/15/2018 by Natale Milch, MD No   HSV-2 infection complicating pregnancy, second trimester 06/09/2018 by Nadara Mustard, MD No   Trichomonas vaginalis infection 04/06/2018 by Tresea Mall, CNM No   Chronic hypertension affecting pregnancy 03/30/2018 by Tresea Mall, CNM No   Chronic post-traumatic stress disorder (PTSD) 03/30/2018 by Tresea Mall, CNM No   Genital herpes affecting pregnancy 03/30/2018 by Tresea Mall, CNM No   Supervision of high risk pregnancy, antepartum 03/30/2018 by Tresea Mall, CNM No   Overview Addendum 07/15/2018 12:35 PM by Natale Milch, MD    Clinic [facility] Prenatal Labs  Dating  8 wk [place] Blood type: O/Positive/-- (12/30 1157)   Genetic Screen   NIPS: normal XX Antibody:Negative (12/30 1157)  Anatomic [place] complete Rubella: 1.80 (12/30 1157) Varicella: Immune  GTT Early:   93  Third trimester:  RPR: Non Reactive (12/30 1157)   Rhogam  not needed HBsAg: Negative (12/30 1157)   TDaP vaccine                       Flu Shot: HIV: Non Reactive (12/30 1157)   Baby Food  Breast                              GBS:   Contraception  Pap: NIL 2018  CBB     CS/VBAC NA   Support Person Casimiro Needle              Preterm labor symptoms and general obstetric precautions including but not  limited to vaginal bleeding, contractions, leaking of fluid and fetal movement were reviewed in detail with the patient. Please refer to After Visit Summary for other counseling recommendations.   - 28 week labs today - growth u/s next visit and q3-4 weeks - BPs look great today.  Return in about 2 weeks (around 09/02/2018) for U/S for growth/afi and routine prenatal after.  Thomasene MohairStephen Shaniqwa Horsman, MD,  Merlinda FrederickFACOG [facility], [facility] 08/19/2018 10:53 AM

## 2018-08-20 LAB — 28 WEEK RH+PANEL
Basophils Absolute: 0 10*3/uL (ref 0.0–0.2)
Basos: 0 %
EOS (ABSOLUTE): 0.2 10*3/uL (ref 0.0–0.4)
Eos: 1 %
Gestational Diabetes Screen: 121 mg/dL (ref 65–139)
HIV Screen 4th Generation wRfx: NONREACTIVE
Hematocrit: 31.4 % — ABNORMAL LOW (ref 34.0–46.6)
Hemoglobin: 10.8 g/dL — ABNORMAL LOW (ref 11.1–15.9)
Immature Grans (Abs): 0.2 10*3/uL — ABNORMAL HIGH (ref 0.0–0.1)
Immature Granulocytes: 2 %
Lymphocytes Absolute: 2.1 10*3/uL (ref 0.7–3.1)
Lymphs: 15 %
MCH: 33.6 pg — ABNORMAL HIGH (ref 26.6–33.0)
MCHC: 34.4 g/dL (ref 31.5–35.7)
MCV: 98 fL — ABNORMAL HIGH (ref 79–97)
Monocytes Absolute: 0.8 10*3/uL (ref 0.1–0.9)
Monocytes: 6 %
Neutrophils Absolute: 10.6 10*3/uL — ABNORMAL HIGH (ref 1.4–7.0)
Neutrophils: 76 %
Platelets: 289 10*3/uL (ref 150–450)
RBC: 3.21 x10E6/uL — ABNORMAL LOW (ref 3.77–5.28)
RDW: 12 % (ref 11.7–15.4)
RPR Ser Ql: NONREACTIVE
WBC: 13.9 10*3/uL — ABNORMAL HIGH (ref 3.4–10.8)

## 2018-08-31 ENCOUNTER — Observation Stay
Admission: EM | Admit: 2018-08-31 | Discharge: 2018-09-01 | Disposition: A | Discharge status: Home / Self Care | Payer: Medicaid Other | Attending: Obstetrics and Gynecology | Admitting: Obstetrics and Gynecology

## 2018-08-31 ENCOUNTER — Other Ambulatory Visit: Payer: Self-pay

## 2018-08-31 DIAGNOSIS — O98813 Other maternal infectious and parasitic diseases complicating pregnancy, third trimester: Principal | ICD-10-CM | POA: Insufficient documentation

## 2018-08-31 DIAGNOSIS — O0990 Supervision of high risk pregnancy, unspecified, unspecified trimester: Secondary | ICD-10-CM

## 2018-08-31 DIAGNOSIS — Z3A30 30 weeks gestation of pregnancy: Secondary | ICD-10-CM | POA: Insufficient documentation

## 2018-08-31 DIAGNOSIS — A599 Trichomoniasis, unspecified: Secondary | ICD-10-CM

## 2018-08-31 DIAGNOSIS — O99333 Smoking (tobacco) complicating pregnancy, third trimester: Secondary | ICD-10-CM

## 2018-08-31 DIAGNOSIS — B373 Candidiasis of vulva and vagina: Secondary | ICD-10-CM | POA: Insufficient documentation

## 2018-08-31 DIAGNOSIS — B3749 Other urogenital candidiasis: Secondary | ICD-10-CM | POA: Diagnosis present

## 2018-08-31 DIAGNOSIS — O98313 Other infections with a predominantly sexual mode of transmission complicating pregnancy, third trimester: Secondary | ICD-10-CM

## 2018-08-31 DIAGNOSIS — O10919 Unspecified pre-existing hypertension complicating pregnancy, unspecified trimester: Secondary | ICD-10-CM

## 2018-08-31 DIAGNOSIS — A6009 Herpesviral infection of other urogenital tract: Secondary | ICD-10-CM

## 2018-08-31 DIAGNOSIS — F4312 Post-traumatic stress disorder, chronic: Secondary | ICD-10-CM

## 2018-08-31 DIAGNOSIS — B009 Herpesviral infection, unspecified: Secondary | ICD-10-CM

## 2018-08-31 DIAGNOSIS — O98512 Other viral diseases complicating pregnancy, second trimester: Secondary | ICD-10-CM

## 2018-08-31 NOTE — OB Triage Note (Signed)
Pt is a 33 y.o G4P2 seen at [facility] for c/o of abdominal pain and vaginal discharge. Pt states the pain started around 1930 and originated on the right side and gradually moved across her abdomen. Pt also states that the abdominal pain causes her to experience vaginal pessure Pt rates her pain a 7/10. Pt believes "she has yeast infection." Pt states "her discharge his clear and thick." FHT 147. Pt denies vaginal bleeding and LOF. Pt states positive fetal movement. Monitors applied and assessing. Will continue to monitor.

## 2018-09-01 DIAGNOSIS — B373 Candidiasis of vulva and vagina: Secondary | ICD-10-CM | POA: Diagnosis not present

## 2018-09-01 DIAGNOSIS — B3749 Other urogenital candidiasis: Secondary | ICD-10-CM

## 2018-09-01 DIAGNOSIS — B379 Candidiasis, unspecified: Secondary | ICD-10-CM | POA: Diagnosis not present

## 2018-09-01 DIAGNOSIS — O10913 Unspecified pre-existing hypertension complicating pregnancy, third trimester: Secondary | ICD-10-CM | POA: Diagnosis not present

## 2018-09-01 DIAGNOSIS — O98813 Other maternal infectious and parasitic diseases complicating pregnancy, third trimester: Secondary | ICD-10-CM | POA: Diagnosis not present

## 2018-09-01 DIAGNOSIS — Z3A30 30 weeks gestation of pregnancy: Secondary | ICD-10-CM | POA: Diagnosis not present

## 2018-09-01 HISTORY — DX: Other urogenital candidiasis: B37.49

## 2018-09-01 MED ORDER — ACETAMINOPHEN 325 MG PO TABS
650.0000 mg | ORAL_TABLET | ORAL | Status: DC | PRN
  Filled 2018-09-01: qty 2

## 2018-09-01 MED ORDER — FLUCONAZOLE 150 MG PO TABS: 150.0000 mg | ORAL_TABLET | Freq: Once | ORAL | 0 refills | Status: AC

## 2018-09-01 NOTE — Discharge Instructions (Signed)
Preterm Labor and Birth Information Pregnancy normally lasts 39-41 weeks. Preterm labor is when labor starts early. It starts before you have been pregnant for 37 whole weeks. What are the risk factors for preterm labor? Preterm labor is more likely to occur in women who:  Have an infection while pregnant.  Have a cervix that is short.  Have gone into preterm labor before.  Have had surgery on their cervix.  Are younger than age 33.  Are older than age 33.  Are African American.  Are pregnant with two or more babies.  Take street drugs while pregnant.  Smoke while pregnant.  Do not gain enough weight while pregnant.  Got pregnant right after another pregnancy. What are the symptoms of preterm labor? Symptoms of preterm labor include:  Cramps. The cramps may feel like the cramps some women get during their period. The cramps may happen with watery poop (diarrhea).  Pain in the belly (abdomen).  Pain in the lower back.  Regular contractions or tightening. It may feel like your belly is getting tighter.  Pressure in the lower belly that seems to get stronger.  More fluid (discharge) leaking from the vagina. The fluid may be watery or bloody.  Water breaking. Why is it important to notice signs of preterm labor? Babies who are born early may not be fully developed. They have a higher chance for:  Long-term heart problems.  Long-term lung problems.  Trouble controlling body systems, like breathing.  Bleeding in the brain.  A condition called cerebral palsy.  Learning difficulties.  Death. These risks are highest for babies who are born before 34 weeks of pregnancy. How is preterm labor treated? Treatment depends on:  How long you were pregnant.  Your condition.  The health of your baby. Treatment may involve:  Having a stitch (suture) placed in your cervix. When you give birth, your cervix opens so the baby can come out. The stitch keeps the cervix  from opening too soon.  Staying at the hospital.  Taking or getting medicines, such as: ? Hormone medicines. ? Medicines to stop contractions. ? Medicines to help the babys lungs develop. ? Medicines to prevent your baby from having cerebral palsy. What should I do if I am in preterm labor? If you think you are going into labor too soon, call your doctor right away. How can I prevent preterm labor?  Do not use any tobacco products. ? Examples of these are cigarettes, chewing tobacco, and e-cigarettes. ? If you need help quitting, ask your doctor.  Do not use street drugs.  Do not use any medicines unless you ask your doctor if they are safe for you.  Talk with your doctor before taking any herbal supplements.  Make sure you gain enough weight.  Watch for infection. If you think you might have an infection, get it checked right away.  If you have gone into preterm labor before, tell your doctor. This information is not intended to replace advice given to you by your health care provider. Make sure you discuss any questions you have with your health care provider. Document Released: 07/04/2008 Document Revised: 09/18/2015 Document Reviewed: 08/29/2015 Elsevier Interactive Patient Education  2019 [employer]    Vaginal Yeast infection, Adult  Vaginal yeast infection is a condition that causes vaginal discharge as well as soreness, swelling, and redness (inflammation) of the vagina. This is a common condition. Some women get this infection frequently. What are the causes? This condition is caused by  a change in the normal balance of the yeast (candida) and bacteria that live in the vagina. This change causes an overgrowth of yeast, which causes the inflammation. What increases the risk? The condition is more likely to develop in women who:  Take antibiotic medicines.  Have diabetes.  Take birth control pills.  Are pregnant.  Douche often.  Have a weak body defense  system (immune system).  Have been taking steroid medicines for a long time.  Frequently wear tight clothing. What are the signs or symptoms? Symptoms of this condition include:  White, thick, creamy vaginal discharge.  Swelling, itching, redness, and irritation of the vagina. The lips of the vagina (vulva) may be affected as well.  Pain or a burning feeling while urinating.  Pain during sex. How is this diagnosed? This condition is diagnosed based on:  Your medical history.  A physical exam.  A pelvic exam. Your health care provider will examine a sample of your vaginal discharge under a microscope. Your health care provider may send this sample for testing to confirm the diagnosis. How is this treated? This condition is treated with medicine. Medicines may be over-the-counter or prescription. You may be told to use one or more of the following:  Medicine that is taken by mouth (orally).  Medicine that is applied as a cream (topically).  Medicine that is inserted directly into the vagina (suppository). Follow these instructions at home:  Lifestyle  Do not have sex until your health care provider approves. Tell your sex partner that you have a yeast infection. That person should go to his or her health care provider and ask if they should also be treated.  Do not wear tight clothes, such as pantyhose or tight pants.  Wear breathable cotton underwear. General instructions  Take or apply over-the-counter and prescription medicines only as told by your health care provider.  Eat more yogurt. This may help to keep your yeast infection from returning.  Do not use tampons until your health care provider approves.  Try taking a sitz bath to help with discomfort. This is a warm water bath that is taken while you are sitting down. The water should only come up to your hips and should cover your buttocks. Do this 3-4 times per day or as told by your health care provider.  Do  not douche.  If you have diabetes, keep your blood sugar levels under control.  Keep all follow-up visits as told by your health care provider. This is important. Contact a health care provider if:  You have a fever.  Your symptoms go away and then return.  Your symptoms do not get better with treatment.  Your symptoms get worse.  You have new symptoms.  You develop blisters in or around your vagina.  You have blood coming from your vagina and it is not your menstrual period.  You develop pain in your abdomen. Summary  Vaginal yeast infection is a condition that causes discharge as well as soreness, swelling, and redness (inflammation) of the vagina.  This condition is treated with medicine. Medicines may be over-the-counter or prescription.  Take or apply over-the-counter and prescription medicines only as told by your health care provider.  Do not douche. Do not have sex or use tampons until your health care provider approves.  Contact a health care provider if your symptoms do not get better with treatment or your symptoms go away and then return. This information is not intended to replace advice given  to you by your health care provider. Make sure you discuss any questions you have with your health care provider. Document Released: 01/15/2005 Document Revised: 08/24/2017 Document Reviewed: 08/24/2017 Elsevier Interactive Patient Education  2019 [employer].

## 2018-09-01 NOTE — Discharge Summary (Signed)
Obstetric H&P   Chief Complaint/Admission Diagnosis: Yeast infection  Discharge Diagnosis: Yeast infection  Prenatal Care Provider: [facility]  History of Present Illness: 33 y.o. [id number] [redacted]w[redacted]d by 11/07/2018, by Ultrasound presenting to L&D with concerns for yeast infection as well as possible contractions earlier today but none currently.  Patient reports thick clear to white discharge and vaginal irritation.  She also noted right upper quadrant pain, that felt like tightening or contractions.  No nausea, fevers, chills, or exacerbation with po intake.  Currently no pain.  Wet mount positive for hyphea. NST reactive without evidence of contractions.  Blood pressure 129/80, pulse (!) 105, temperature 98.3 F (36.8 C), temperature source Oral, resp. rate 16, height  (1.727 m), weight 101.6 kg, last menstrual period 01/20/2018.   Pregravid weight 97.5 kg Total Weight Gain 4.082 kg  pregnancy Problems (from 03/29/18 to present)    Problem Noted Resolved   Tobacco use during pregnancy 07/15/2018 by Natale Milch, MD No   HSV-2 infection complicating pregnancy, second trimester 06/09/2018 by Nadara Mustard, MD No   Trichomonas vaginalis infection 04/06/2018 by Tresea Mall, CNM No   Chronic hypertension affecting pregnancy 03/30/2018 by Tresea Mall, CNM No   Chronic post-traumatic stress disorder (PTSD) 03/30/2018 by Tresea Mall, CNM No   Genital herpes affecting pregnancy 03/30/2018 by Tresea Mall, CNM No   Supervision of high risk pregnancy, antepartum 03/30/2018 by Tresea Mall, CNM No   Overview Addendum 07/15/2018 12:35 PM by Natale Milch, MD    Clinic [facility] Prenatal Labs  Dating  8 wk [place] Blood type: O/Positive/-- (12/30 1157)   Genetic Screen   NIPS: normal XX Antibody:Negative (12/30 1157)  Anatomic [place] complete Rubella: 1.80 (12/30 1157) Varicella: Immune  GTT Early:   93  Third trimester:  RPR: Non Reactive (12/30 1157)   Rhogam  not needed  HBsAg: Negative (12/30 1157)   TDaP vaccine                       Flu Shot: HIV: Non Reactive (12/30 1157)   Baby Food  Breast                              GBS:   Contraception  Pap: NIL 2018  CBB     CS/VBAC NA   Support Person Casimiro Needle              Review of Systems: 10 point review of systems negative unless otherwise noted in HPI  Past Medical History: Past Medical History:  Diagnosis Date  . Abnormal Pap smear of cervix    2006/2010  . Anxiety   . Depression   . 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 GERD (gastroesophageal reflux disease)    OTC GUMMIES PRN  . Heart murmur    AS A  CHILD  . Hypertension   . Indication for care in labor and delivery, antepartum 05/23/2015  . Postpartum care following vaginal delivery 05/25/2015  . Preterm contractions 04/11/2015    Past Surgical History: Past Surgical History:  Procedure Laterality Date  . APPENDECTOMY  april 2016  . CHOLECYSTECTOMY N/A 11/19/2016   Procedure: LAPAROSCOPIC CHOLECYSTECTOMY;  Surgeon: Henrene Dodge, MD;  Location: [facility] ORS;  Service: General;  Laterality: N/A;  . colposcopy of cervix     2007/2010  . DILATION AND CURETTAGE OF UTERUS      Past  Obstetric History: #: 1, Date: 04/21/05, Sex: None, Weight: None, GA: None, Delivery: None, Apgar1: None, Apgar5: None, Living: None, Birth Comments: None  #: 2, Date: 12/12/07, Sex: Female, Weight: 3345 g, GA: None, Delivery: Vaginal, Spontaneous, Apgar1: None, Apgar5: None, Living: Living, Birth Comments: None  #: 3, Date: 05/24/15, Sex: Female, Weight: 3374 g, GA: [redacted]w[redacted]d, Delivery: Vaginal, Spontaneous, Apgar1: 8, Apgar5: 9, Living: Living, Birth Comments: None  #: 4, Date: None, Sex: None, Weight: None, GA: None, Delivery: None, Apgar1: None, Apgar5: None, Living: None, Birth Comments: None   Past Gynecologic History:  Family History: Family History  Problem Relation Age of Onset  . Hypertension Mother   . Depression Mother   .  Hypertension Father     Social History: Social History   Socioeconomic History  . Marital status: Single    Spouse name: Not on file  . Number of children: Not on file  . Years of education: Not on file  . Highest education level: Not on file  Occupational History  . Not on file  Social Needs  . Financial resource strain: Not on file  . Food insecurity:    Worry: Not on file    Inability: Not on file  . Transportation needs:    Medical: Not on file    Non-medical: Not on file  Tobacco Use  . Smoking status: Current Every Day Smoker    Packs/day: 0.25    Years: 10.00    Pack years: 2.50    Types: Cigarettes  . Smokeless tobacco: Never Used  Substance and Sexual Activity  . Alcohol use: Not Currently    Comment: OCC  . Drug use: No  . Sexual activity: Yes    Birth control/protection: Surgical  Lifestyle  . Physical activity:    Days per week: Not on file    Minutes per session: Not on file  . Stress: Not on file  Relationships  . Social connections:    Talks on phone: Not on file    Gets together: Not on file    Attends religious service: Not on file    Active member of club or organization: Not on file    Attends meetings of clubs or organizations: Not on file    Relationship status: Not on file  . Intimate partner violence:    Fear of current or ex partner: Not on file    Emotionally abused: Not on file    Physically abused: Not on file    Forced sexual activity: Not on file  Other Topics Concern  . Not on file  Social History Narrative  . Not on file    Medications: Prior to Admission medications   Medication Sig Start Date End Date Taking? Authorizing Provider  busPIRone (BUSPAR) 10 MG tablet TK 1/2 TO 1 T PO BID FOR MOOD 07/15/17  Yes [provider]  cetirizine (ZYRTEC) 10 MG tablet Take 10 mg by mouth daily. 05/11/18  Yes [provider]  Prenat w/o A Vit-FeFum-FePo-FA (PROVIDA OB) 20-20-1.25 MG CAPS Take 1 tablet by mouth daily.  04/19/18  Yes Tresea Mall, CNM  valACYclovir (VALTREX) 500 MG tablet Take 1 tablet (500 mg total) by mouth daily. 06/09/18  Yes Nadara Mustard, MD  aspirin EC 81 MG tablet Take 1 tablet (81 mg total) by mouth daily. Take after 12 weeks for prevention of preeclampssia later in pregnancy Patient not taking: Reported on 07/29/2018 07/15/18   Natale Milch, MD  fluconazole (DIFLUCAN) 150 MG tablet  Take 1 tablet (150 mg total) by mouth once for 1 dose. Can take additional dose three days later if symptoms persist 09/01/18 09/01/18  Vena AustriaStaebler, Sumire Halbleib, MD  nicotine polacrilex (RA NICOTINE GUM) 2 MG gum Take 1 each (2 mg total) by mouth as needed for smoking cessation. Patient not taking: Reported on 07/29/2018 07/15/18   Natale MilchSchuman, Christanna R, MD  prochlorperazine (COMPAZINE) 10 MG tablet Take 1 tablet (10 mg total) by mouth every 6 (six) hours as needed for nausea or vomiting (headaches). Patient not taking: Reported on 07/15/2018 05/17/18   Vena AustriaStaebler, Ravin Denardo, MD    Allergies: Allergies  Allergen Reactions  . Lisinopril Cough    Physical Exam: Vitals: Blood pressure 126/73, pulse (!) 117, temperature 98.7 F (37.1 C), temperature source Oral, resp. rate 16, height 5\' 8"  (1.727 m), weight 101.6 kg, last menstrual period 01/20/2018.  FHT: 125, moderate, +accels, no decels Toco: none  General: NAD, well nourished, appears stated age HEENT: normocephalic, anicteric Pulmonary: No increased work of breathing Abdomen: Gravid, non-tender Leopolds: vtx Genitourinary: closed, thick, high Extremities: no edema, erythema, or tenderness Neurologic: Grossly intact Psychiatric: mood appropriate, affect full  Labs: No results found for this or any previous visit (from the past 24 hour(s)).  Assessment: 33 y.o. [id number] by 11/07/2018, by Ultrasound presenting with vaginal yeast infection  Plan: 1) Vaginal candida - rx diflucan  2) Fetus - cat I tracing  3) PNL - Blood type O/Positive/--  (12/30 1157) / Anti-bodyscreen Negative (12/30 1157) / Rubella 1.80 (12/30 1157) / Varicella Immune / RPR Non Reactive (04/30 1056) / HBsAg Negative (12/30 1157) / HIV Non Reactive (04/30 1056) / 1-hr OGTT 93 / GBS unknown  4) Immunization History -  Immunization History  Administered Date(s) Administered  . Influenza,inj,Quad PF,6+ Mos 05/26/2015    5) 30 minutes spent in co-ordination of care  6) Disposition - discharge home  Vena AustriaAndreas Seydina Holliman, MD, Merlinda FrederickFACOG [facility], [facility] 09/01/2018, 12:18 AM

## 2018-09-03 ENCOUNTER — Other Ambulatory Visit: Payer: Self-pay

## 2018-09-03 ENCOUNTER — Ambulatory Visit (INDEPENDENT_AMBULATORY_CARE_PROVIDER_SITE_OTHER): Payer: Medicaid Other

## 2018-09-03 ENCOUNTER — Ambulatory Visit (INDEPENDENT_AMBULATORY_CARE_PROVIDER_SITE_OTHER): Payer: Medicaid Other | Admitting: Obstetrics & Gynecology

## 2018-09-03 ENCOUNTER — Encounter: Payer: Self-pay | Admitting: Obstetrics & Gynecology

## 2018-09-03 VITALS — BP 110/60 | Wt 225.0 lb

## 2018-09-03 DIAGNOSIS — O10919 Unspecified pre-existing hypertension complicating pregnancy, unspecified trimester: Secondary | ICD-10-CM

## 2018-09-03 DIAGNOSIS — O0990 Supervision of high risk pregnancy, unspecified, unspecified trimester: Secondary | ICD-10-CM

## 2018-09-03 DIAGNOSIS — O10913 Unspecified pre-existing hypertension complicating pregnancy, third trimester: Secondary | ICD-10-CM

## 2018-09-03 DIAGNOSIS — Z3A30 30 weeks gestation of pregnancy: Secondary | ICD-10-CM

## 2018-09-03 DIAGNOSIS — Z23 Encounter for immunization: Secondary | ICD-10-CM | POA: Diagnosis not present

## 2018-09-03 LAB — POCT URINALYSIS DIPSTICK OB
Glucose, UA: NEGATIVE
POC,PROTEIN,UA: NEGATIVE

## 2018-09-03 NOTE — Patient Instructions (Signed)
Third Trimester of Pregnancy The third trimester is from week 28 through week 40 (months 7 through 9). The third trimester is a time when the unborn baby (fetus) is growing rapidly. At the end of the ninth month, the fetus is about 20 inches in length and weighs 6-10 pounds. Body changes during your third trimester Your body will continue to go through many changes during pregnancy. The changes vary from woman to woman. During the third trimester:  Your weight will continue to increase. You can expect to gain 25-35 pounds (11-16 kg) by the end of the pregnancy.  You may begin to get stretch marks on your hips, abdomen, and breasts.  You may urinate more often because the fetus is moving lower into your pelvis and pressing on your bladder.  You may develop or continue to have heartburn. This is caused by increased hormones that slow down muscles in the digestive tract.  You may develop or continue to have constipation because increased hormones slow digestion and cause the muscles that push waste through your intestines to relax.  You may develop hemorrhoids. These are swollen veins (varicose veins) in the rectum that can itch or be painful.  You may develop swollen, bulging veins (varicose veins) in your legs.  You may have increased body aches in the pelvis, back, or thighs. This is due to weight gain and increased hormones that are relaxing your joints.  You may have changes in your hair. These can include thickening of your hair, rapid growth, and changes in texture. Some women also have hair loss during or after pregnancy, or hair that feels dry or thin. Your hair will most likely return to normal after your baby is born.  Your breasts will continue to grow and they will continue to become tender. A yellow fluid (colostrum) may leak from your breasts. This is the first milk you are producing for your baby.  Your belly button may stick out.  You may notice more swelling in your hands,  face, or ankles.  You may have increased tingling or numbness in your hands, arms, and legs. The skin on your belly may also feel numb.  You may feel short of breath because of your expanding uterus.  You may have more problems sleeping. This can be caused by the size of your belly, increased need to urinate, and an increase in your body's metabolism.  You may notice the fetus "dropping," or moving lower in your abdomen (lightening).  You may have increased vaginal discharge.  You may notice your joints feel loose and you may have pain around your pelvic bone. What to expect at prenatal visits You will have prenatal exams every 2 weeks until week 36. Then you will have weekly prenatal exams. During a routine prenatal visit:  You will be weighed to make sure you and the baby are growing normally.  Your blood pressure will be taken.  Your abdomen will be measured to track your baby's growth.  The fetal heartbeat will be listened to.  Any test results from the previous visit will be discussed.  You may have a cervical check near your due date to see if your cervix has softened or thinned (effaced).  You will be tested for Group B streptococcus. This happens between 35 and 37 weeks. Your health care provider may ask you:  What your birth plan is.  How you are feeling.  If you are feeling the baby move.  If you have had any abnormal   symptoms, such as leaking fluid, bleeding, severe headaches, or abdominal cramping.  If you are using any tobacco products, including cigarettes, chewing tobacco, and electronic cigarettes.  If you have any questions. Other tests or screenings that may be performed during your third trimester include:  Blood tests that check for low iron levels (anemia).  Fetal testing to check the health, activity level, and growth of the fetus. Testing is done if you have certain medical conditions or if there are problems during the pregnancy.  Nonstress test  (NST). This test checks the health of your baby to make sure there are no signs of problems, such as the baby not getting enough oxygen. During this test, a belt is placed around your belly. The baby is made to move, and its heart rate is monitored during movement. What is false labor? False labor is a condition in which you feel small, irregular tightenings of the muscles in the womb (contractions) that usually go away with rest, changing position, or drinking water. These are called Braxton Hicks contractions. Contractions may last for hours, days, or even weeks before true labor sets in. If contractions come at regular intervals, become more frequent, increase in intensity, or become painful, you should see your health care provider. What are the signs of labor?  Abdominal cramps.  Regular contractions that start at 10 minutes apart and become stronger and more frequent with time.  Contractions that start on the top of the uterus and spread down to the lower abdomen and back.  Increased pelvic pressure and dull back pain.  A watery or bloody mucus discharge that comes from the vagina.  Leaking of amniotic fluid. This is also known as your "water breaking." It could be a slow trickle or a gush. Let your health care provider know if it has a color or strange odor. If you have any of these signs, call your health care provider right away, even if it is before your due date. Follow these instructions at home: Medicines  Follow your health care provider's instructions regarding medicine use. Specific medicines may be either safe or unsafe to take during pregnancy.  Take a prenatal vitamin that contains at least 600 micrograms (mcg) of folic acid.  If you develop constipation, try taking a stool softener if your health care provider approves. Eating and drinking   Eat a balanced diet that includes fresh fruits and vegetables, whole grains, good sources of protein such as meat, eggs, or tofu,  and low-fat dairy. Your health care provider will help you determine the amount of weight gain that is right for you.  Avoid raw meat and uncooked cheese. These carry germs that can cause birth defects in the baby.  If you have low calcium intake from food, talk to your health care provider about whether you should take a daily calcium supplement.  Eat four or five small meals rather than three large meals a day.  Limit foods that are high in fat and processed sugars, such as fried and sweet foods.  To prevent constipation: ? Drink enough fluid to keep your urine clear or pale yellow. ? Eat foods that are high in fiber, such as fresh fruits and vegetables, whole grains, and beans. Activity  Exercise only as directed by your health care provider. Most women can continue their usual exercise routine during pregnancy. Try to exercise for 30 minutes at least 5 days a week. Stop exercising if you experience uterine contractions.  Avoid heavy lifting.  Do   not exercise in extreme heat or humidity, or at high altitudes.  Wear low-heel, comfortable shoes.  Practice good posture.  You may continue to have sex unless your health care provider tells you otherwise. Relieving pain and discomfort  Take frequent breaks and rest with your legs elevated if you have leg cramps or low back pain.  Take warm sitz baths to soothe any pain or discomfort caused by hemorrhoids. Use hemorrhoid cream if your health care provider approves.  Wear a good support bra to prevent discomfort from breast tenderness.  If you develop varicose veins: ? Wear support pantyhose or compression stockings as told by your healthcare provider. ? Elevate your feet for 15 minutes, 3-4 times a day. Prenatal care  Write down your questions. Take them to your prenatal visits.  Keep all your prenatal visits as told by your health care provider. This is important. Safety  Wear your seat belt at all times when driving.  Make  a list of emergency phone numbers, including numbers for family, friends, the hospital, and police and fire departments. General instructions  Avoid cat litter boxes and soil used by cats. These carry germs that can cause birth defects in the baby. If you have a cat, ask someone to clean the litter box for you.  Do not travel far distances unless it is absolutely necessary and only with the approval of your health care provider.  Do not use hot tubs, steam rooms, or saunas.  Do not drink alcohol.  Do not use any products that contain nicotine or tobacco, such as cigarettes and e-cigarettes. If you need help quitting, ask your health care provider.  Do not use any medicinal herbs or unprescribed drugs. These chemicals affect the formation and growth of the baby.  Do not douche or use tampons or scented sanitary pads.  Do not cross your legs for long periods of time.  To prepare for the arrival of your baby: ? Take prenatal classes to understand, practice, and ask questions about labor and delivery. ? Make a trial run to the hospital. ? Visit the hospital and tour the maternity area. ? Arrange for maternity or paternity leave through employers. ? Arrange for family and friends to take care of pets while you are in the hospital. ? Purchase a rear-facing car seat and make sure you know how to install it in your car. ? Pack your hospital bag. ? Prepare the baby's nursery. Make sure to remove all pillows and stuffed animals from the baby's crib to prevent suffocation.  Visit your dentist if you have not gone during your pregnancy. Use a soft toothbrush to brush your teeth and be gentle when you floss. Contact a health care provider if:  You are unsure if you are in labor or if your water has broken.  You become dizzy.  You have mild pelvic cramps, pelvic pressure, or nagging pain in your abdominal area.  You have lower back pain.  You have persistent nausea, vomiting, or  diarrhea.  You have an unusual or bad smelling vaginal discharge.  You have pain when you urinate. Get help right away if:  Your water breaks before 37 weeks.  You have regular contractions less than 5 minutes apart before 37 weeks.  You have a fever.  You are leaking fluid from your vagina.  You have spotting or bleeding from your vagina.  You have severe abdominal pain or cramping.  You have rapid weight loss or weight gain.  You have   shortness of breath with chest pain.  You notice sudden or extreme swelling of your face, hands, ankles, feet, or legs.  Your baby makes fewer than 10 movements in 2 hours.  You have severe headaches that do not go away when you take medicine.  You have vision changes. Summary  The third trimester is from week 28 through week 40, months 7 through 9. The third trimester is a time when the unborn baby (fetus) is growing rapidly.  During the third trimester, your discomfort may increase as you and your baby continue to gain weight. You may have abdominal, leg, and back pain, sleeping problems, and an increased need to urinate.  During the third trimester your breasts will keep growing and they will continue to become tender. A yellow fluid (colostrum) may leak from your breasts. This is the first milk you are producing for your baby.  False labor is a condition in which you feel small, irregular tightenings of the muscles in the womb (contractions) that eventually go away. These are called Braxton Hicks contractions. Contractions may last for hours, days, or even weeks before true labor sets in.  Signs of labor can include: abdominal cramps; regular contractions that start at 10 minutes apart and become stronger and more frequent with time; watery or bloody mucus discharge that comes from the vagina; increased pelvic pressure and dull back pain; and leaking of amniotic fluid. This information is not intended to replace advice given to you by your  health care provider. Make sure you discuss any questions you have with your health care provider. Document Released: 04/01/2001 Document Revised: 05/13/2016 Document Reviewed: 05/13/2016 Elsevier Interactive Patient Education  2019 [employer]  

## 2018-09-03 NOTE — Progress Notes (Signed)
  Subjective  Fetal Movement? yes Contractions? no Leaking Fluid? no Vaginal Bleeding? no  Objective  BP 110/60   Wt 225 lb (102.1 kg)   LMP 01/20/2018   BMI 34.21 kg/m  General: NAD Pumonary: no increased work of breathing Abdomen: gravid, non-tender Extremities: no edema Psychiatric: mood appropriate, affect full  Assessment  33 y.o. [id number] at [redacted]w[redacted]d by  11/07/2018, by Ultrasound presenting for routine prenatal visit  Plan   Problem List Items Addressed This Visit      Cardiovascular and Mediastinum   Chronic hypertension affecting pregnancy     Other   Supervision of high risk pregnancy, antepartum    Other Visit Diagnoses    [redacted] weeks gestation of pregnancy    -  Primary   Relevant Orders   POC Urinalysis Dipstick OB (Completed)   Need for Tdap vaccination       Relevant Orders   POC Urinalysis Dipstick OB (Completed)      pregnancy Problems (from 03/29/18 to present)    Problem Noted Resolved   Tobacco use during pregnancy 07/15/2018 by Natale Milch, MD No   HSV-2 infection complicating pregnancy, second trimester 06/09/2018 by Nadara Mustard, MD No   Trichomonas vaginalis infection 04/06/2018 by Tresea Mall, CNM No   Chronic hypertension affecting pregnancy 03/30/2018 by Tresea Mall, CNM No   Chronic post-traumatic stress disorder (PTSD) 03/30/2018 by Tresea Mall, CNM No   Genital herpes affecting pregnancy 03/30/2018 by Tresea Mall, CNM No   Supervision of high risk pregnancy, antepartum 03/30/2018 by Tresea Mall, CNM No   Overview Addendum 07/15/2018 12:35 PM by Natale Milch, MD    Clinic [facility] Prenatal Labs  Dating  8 wk [place] Blood type: O/Positive/-- (12/30 1157)   Genetic Screen   NIPS: normal XX Antibody:Negative (12/30 1157)  Anatomic [place] complete Rubella: 1.80 (12/30 1157) Varicella: Immune  GTT Early:   93  Third trimester:  RPR: Non Reactive (12/30 1157)   Rhogam  not needed HBsAg: Negative (12/30 1157)   TDaP  vaccine                       Flu Shot: HIV: Non Reactive (12/30 1157)   Baby Food  Breast                              GBS: p  Contraception  BTL Pap: NIL 2018  CBB  No   CS/VBAC NA   Support Person Michael            No s/sx HTN or preeaclmpsia  Contraception discussed, prefers PP BTL  TDaP today  BTL papers signed today  Annamarie Major, MD, [facility], [facility] 09/03/2018  11:39 AM

## 2018-09-17 ENCOUNTER — Other Ambulatory Visit: Payer: Self-pay

## 2018-09-17 ENCOUNTER — Ambulatory Visit (INDEPENDENT_AMBULATORY_CARE_PROVIDER_SITE_OTHER): Payer: Medicaid Other | Admitting: Obstetrics & Gynecology

## 2018-09-17 ENCOUNTER — Encounter: Payer: Self-pay | Admitting: Obstetrics & Gynecology

## 2018-09-17 VITALS — BP 120/85 | Wt 224.0 lb

## 2018-09-17 DIAGNOSIS — O0990 Supervision of high risk pregnancy, unspecified, unspecified trimester: Secondary | ICD-10-CM

## 2018-09-17 DIAGNOSIS — O10919 Unspecified pre-existing hypertension complicating pregnancy, unspecified trimester: Secondary | ICD-10-CM

## 2018-09-17 DIAGNOSIS — O10913 Unspecified pre-existing hypertension complicating pregnancy, third trimester: Secondary | ICD-10-CM

## 2018-09-17 DIAGNOSIS — Z3A32 32 weeks gestation of pregnancy: Secondary | ICD-10-CM

## 2018-09-17 LAB — POCT URINALYSIS DIPSTICK OB: Glucose, UA: NEGATIVE

## 2018-09-17 NOTE — Patient Instructions (Signed)
What You Need to Know About Female Sterilization  Female sterilization is surgery to prevent pregnancy. In this surgery, the fallopian tubes are either blocked or closed off. This prevents eggs from reaching the uterus so that the eggs cannot be fertilized by sperm and you cannot get pregnant.  Sterilization is permanent. It should only be done if you are sure that you do not want to be able to have children.  What are the sterilization surgery options?  There are several kinds of female sterilization surgeries. They include:   Laparoscopic tubal ligation. In this surgery, the fallopian tubes are tied off, sealed with heat, or blocked with a clip, ring, or clamp. A small portion of each fallopian tube may also be removed. This surgery is done through several small cuts (incisions).   Postpartum tubal ligation. This is also called a mini-laparotomy. This surgery is done right after childbirth or 1 or 2 days after childbirth. In this surgery, the fallopian tubes are tied off, sealed with heat, or blocked with a clip, ring, or clamp. A small portion of each fallopian tube may also be removed. The surgery is done through a single incision.   Hysteroscopic sterilization. In this surgery, a tiny, spring-like coil is inserted through the cervix and uterus into the fallopian tubes. The coil causes scarring, which blocks the tubes. After the surgery, contraception should be used for 3 months to allow the scar tissue to form completely.  Is sterilization safe?  Generally, sterilization is safe. Complications are rare. However, there are risks. They include:   Bleeding.   Infection.   Reaction to medicine used during the procedure.   Injury to surrounding organs.   Failure of the procedure.  How effective is sterilization?  Sterilization is nearly 100% effective, but it can fail. Also, the fallopian tubes can grow back together over time. If this happens, you will be able to get pregnant again.  Women who have had this  procedure have a higher chance of having an ectopic pregnancy. An ectopic pregnancy is a pregnancy that happens outside of the uterus. This kind of pregnancy is unsuccessful and can lead to serious bleeding if it is not treated.  What are the benefits?   It is usually effective for a lifetime.   It is usually safe.   It does not have the drawbacks of other types of birth control: That means:  ? Your hormones are not affected. Because of this, your menstrual periods, sexual desire, and sexual performance will not be affected.  ? There are no side effects.  What are the drawbacks?   If you change your mind and decide that you want to have children, you may not be able to. Sterilization may be reversed, but a reversal is not always successful.   It does not provide protection against STDs (sexually transmitted diseases).   It increases the chance of having an ectopic pregnancy.  This information is not intended to replace advice given to you by your health care provider. Make sure you discuss any questions you have with your health care provider.  Document Released: 09/24/2007 Document Revised: 11/29/2015 Document Reviewed: 01/02/2015  Elsevier Interactive Patient Education  2019 [employer] Inc[employer]

## 2018-09-17 NOTE — Progress Notes (Signed)
  Subjective  Fetal Movement? yes Contractions? no Leaking Fluid? no Vaginal Bleeding? no  Objective  BP 120/85   Wt 224 lb (101.6 kg)   LMP 01/20/2018   BMI 34.06 kg/m  General: NAD Pumonary: no increased work of breathing Abdomen: gravid, non-tender Extremities: no edema Psychiatric: mood appropriate, affect full  Assessment  33 y.o. [id number] at [redacted]w[redacted]d by  11/07/2018, by Ultrasound presenting for routine prenatal visit  Plan   Problem List Items Addressed This Visit      Cardiovascular and Mediastinum   Chronic hypertension affecting pregnancy     Other   Supervision of high risk pregnancy, antepartum    Other Visit Diagnoses    [redacted] weeks gestation of pregnancy    -  Primary   Relevant Orders   POC Urinalysis Dipstick OB (Completed)    BTL papers signed today Monitor FM.  If decreases, then consider [place] and AFI  pregnancy Problems (from 03/29/18 to present)    Problem Noted Resolved   Tobacco use during pregnancy 07/15/2018 by Natale Milch, MD No   HSV-2 infection complicating pregnancy, second trimester 06/09/2018 by Nadara Mustard, MD No   Trichomonas vaginalis infection 04/06/2018 by Tresea Mall, CNM No   Chronic hypertension affecting pregnancy 03/30/2018 by Tresea Mall, CNM No   Chronic post-traumatic stress disorder (PTSD) 03/30/2018 by Tresea Mall, CNM No   Genital herpes affecting pregnancy 03/30/2018 by Tresea Mall, CNM No   Supervision of high risk pregnancy, antepartum 03/30/2018 by Tresea Mall, CNM No   Overview Addendum 09/17/2018 10:31 AM by Nadara Mustard, MD    Clinic [facility] Prenatal Labs  Dating  8 wk [place] Blood type: O/Positive/-- (12/30 1157)   Genetic Screen   NIPS: normal XX Antibody:Negative (12/30 1157)  Anatomic [place] complete Rubella: 1.80 (12/30 1157) Varicella: Immune  GTT Early:   93  Third trimester: nml  RPR: Non Reactive (12/30 1157)   Rhogam  not needed HBsAg: Negative (12/30 1157)   TDaP vaccine        09/03/2018     Flu Shot:no HIV: Non Reactive (12/30 1157)   Baby Food  Breast                              GBS:   Contraception   BTL, papers signed 5/29 Pap: NIL 2018  CBB  No   CS/VBAC NA   Support Person Levy Sjogren, MD, [facility], [facility] 09/17/2018  10:42 AM

## 2018-09-22 ENCOUNTER — Other Ambulatory Visit: Payer: Self-pay

## 2018-09-22 ENCOUNTER — Ambulatory Visit (INDEPENDENT_AMBULATORY_CARE_PROVIDER_SITE_OTHER): Payer: Medicaid Other | Admitting: Obstetrics and Gynecology

## 2018-09-22 ENCOUNTER — Telehealth: Payer: Self-pay

## 2018-09-22 ENCOUNTER — Encounter: Payer: Self-pay | Admitting: Obstetrics and Gynecology

## 2018-09-22 VITALS — BP 140/80 | Wt 226.0 lb

## 2018-09-22 DIAGNOSIS — B009 Herpesviral infection, unspecified: Secondary | ICD-10-CM

## 2018-09-22 DIAGNOSIS — O10919 Unspecified pre-existing hypertension complicating pregnancy, unspecified trimester: Secondary | ICD-10-CM

## 2018-09-22 DIAGNOSIS — O10913 Unspecified pre-existing hypertension complicating pregnancy, third trimester: Secondary | ICD-10-CM

## 2018-09-22 DIAGNOSIS — Z3A33 33 weeks gestation of pregnancy: Secondary | ICD-10-CM

## 2018-09-22 DIAGNOSIS — O0990 Supervision of high risk pregnancy, unspecified, unspecified trimester: Secondary | ICD-10-CM

## 2018-09-22 DIAGNOSIS — O98513 Other viral diseases complicating pregnancy, third trimester: Secondary | ICD-10-CM

## 2018-09-22 DIAGNOSIS — O0993 Supervision of high risk pregnancy, unspecified, third trimester: Secondary | ICD-10-CM

## 2018-09-22 LAB — POCT URINALYSIS DIPSTICK OB: Glucose, UA: NEGATIVE

## 2018-09-22 MED ORDER — NIFEDIPINE ER OSMOTIC RELEASE 30 MG PO TB24: 30.0000 mg | ORAL_TABLET | Freq: Every day | ORAL | 2 refills | Status: DC

## 2018-09-22 NOTE — Telephone Encounter (Signed)
Pt caling c/o H/A off and on for 3d; takes tylenol; read it could be preeclampsia.  [id number]  Pt states also has nausea.  Adv allowed 16oz of caffeine a day incl choc; stay hydrated.  Given [facility] will bring in for eval.  Tx'd to [facility] for scheduling.

## 2018-09-22 NOTE — Progress Notes (Signed)
Routine Prenatal Care Visit  Subjective  Karen Wallace is a 33 y.o. [phone] at [redacted]w[redacted]d being seen today for ongoing prenatal care.  She is currently monitored for the following issues for this high-risk pregnancy and has Chronic hypertension affecting pregnancy; Chronic post-traumatic stress disorder (PTSD); Genital herpes affecting pregnancy; Supervision of high risk pregnancy, antepartum; Trichomonas vaginalis infection; RLQ abdominal pain; Tobacco abuse counseling; HSV-2 infection complicating pregnancy, second trimester; Depression; Anxiety; Tobacco use during pregnancy; and Genitourinary infection, candidal on their problem list.  ----------------------------------------------------------------------------------- Patient reports headache.   Contractions: Not present. Vag. Bleeding: None.  Movement: Present. Denies leaking of fluid.  ----------------------------------------------------------------------------------- The following portions of the patient's history were reviewed and updated as appropriate: allergies, current medications, past family history, past medical history, past social history, past surgical history and problem list. Problem list updated.   Objective  Blood pressure 140/80, weight 226 lb (102.5 kg), last menstrual period 01/20/2018. Pregravid weight 215 lb (97.5 kg) Total Weight Gain 11 lb (4.99 kg) Urinalysis:      Fetal Status: Fetal Heart Rate (bpm): 145 Fundal Height: 34 cm Movement: Present     General:  Alert, oriented and cooperative. Patient is in no acute distress.  Skin: Skin is warm and dry. No rash noted.   Cardiovascular: Normal heart rate noted  Respiratory: Normal respiratory effort, no problems with respiration noted  Abdomen: Soft, gravid, appropriate for gestational age. Pain/Pressure: Absent     Pelvic:  Cervical exam deferred        Extremities: Normal range of motion.  Edema: Trace  Mental Status: Normal mood and affect. Normal behavior.  Normal judgment and thought content.     Assessment   33 y.o. [id number] at [redacted]w[redacted]d by  11/07/2018, by Ultrasound presenting for routine prenatal visit  Plan   pregnancy Problems (from 03/29/18 to present)    Problem Noted Resolved   Tobacco use during pregnancy 07/15/2018 by Natale Milch, MD No   HSV-2 infection complicating pregnancy, second trimester 06/09/2018 by Nadara Mustard, MD No   Trichomonas vaginalis infection 04/06/2018 by Tresea Mall, CNM No   Chronic hypertension affecting pregnancy 03/30/2018 by Tresea Mall, CNM No   Chronic post-traumatic stress disorder (PTSD) 03/30/2018 by Tresea Mall, CNM No   Genital herpes affecting pregnancy 03/30/2018 by Tresea Mall, CNM No   Supervision of high risk pregnancy, antepartum 03/30/2018 by Tresea Mall, CNM No   Overview Addendum 09/17/2018 10:31 AM by Nadara Mustard, MD    [facility] Prenatal Labs  Dating  8 wk [place] Blood type: O/Positive/-- (12/30 1157)   Genetic Screen   NIPS: normal XX Antibody:Negative (12/30 1157)  Anatomic [place] complete Rubella: 1.80 (12/30 1157) Varicella: Immune  GTT Early:   93  Third trimester: nml  RPR: Non Reactive (12/30 1157)   Rhogam  not needed HBsAg: Negative (12/30 1157)   TDaP vaccine       09/03/2018     Flu Shot:no HIV: Non Reactive (12/30 1157)   Baby Food  Breast                              GBS:   Contraception   BTL, papers signed 5/29 Pap: NIL 2018  CBB  No   CS/VBAC NA   Support Person Casimiro Needle              Gestational age appropriate obstetric precautions including but not limited to vaginal bleeding, contractions,  leaking of fluid and fetal movement were reviewed in detail with the patient.    Return in about 1 week (around 09/29/2018) for ROB in person.  Natale Milch MD [facility], [facility] 09/22/2018, 4:51 PM     Routine Prenatal Care Visit  Subjective  Karen Wallace is a 33 y.o. [id number] at [redacted]w[redacted]d being seen today  for ongoing prenatal care.  She is currently monitored for the following issues for this high-risk pregnancy and has Chronic hypertension affecting pregnancy; Chronic post-traumatic stress disorder (PTSD); Genital herpes affecting pregnancy; Supervision of high risk pregnancy, antepartum; Trichomonas vaginalis infection; RLQ abdominal pain; Tobacco abuse counseling; HSV-2 infection complicating pregnancy, second trimester; Depression; Anxiety; Tobacco use during pregnancy; and Genitourinary infection, candidal on their problem list.  ----------------------------------------------------------------------------------- Patient reports headache.   Contractions: Not present. Vag. Bleeding: None.  Movement: Present. Denies leaking of fluid.  ----------------------------------------------------------------------------------- The following portions of the patient's history were reviewed and updated as appropriate: allergies, current medications, past family history, past medical history, past social history, past surgical history and problem list. Problem list updated.   Objective  Blood pressure 140/80, weight 226 lb (102.5 kg), last menstrual period 01/20/2018. Pregravid weight 215 lb (97.5 kg) Total Weight Gain 11 lb (4.99 kg) Urinalysis:      Fetal Status: Fetal Heart Rate (bpm): 145 Fundal Height: 34 cm Movement: Present     General:  Alert, oriented and cooperative. Patient is in no acute distress.  Skin: Skin is warm and dry. No rash noted.   Cardiovascular: Normal heart rate noted  Respiratory: Normal respiratory effort, no problems with respiration noted  Abdomen: Soft, gravid, appropriate for gestational age. Pain/Pressure: Absent     Pelvic:  Cervical exam deferred        Extremities: Normal range of motion.  Edema: Trace  Mental Status: Normal mood and affect. Normal behavior. Normal judgment and thought content.     Assessment   33 y.o. [id number] at [redacted]w[redacted]d by  11/07/2018, by Ultrasound  presenting for routine prenatal visit  Plan   pregnancy Problems (from 03/29/18 to present)    Problem Noted Resolved   Tobacco use during pregnancy 07/15/2018 by Natale Milch, MD No   HSV-2 infection complicating pregnancy, second trimester 06/09/2018 by Nadara Mustard, MD No   Trichomonas vaginalis infection 04/06/2018 by Tresea Mall, CNM No   Chronic hypertension affecting pregnancy 03/30/2018 by Tresea Mall, CNM No   Chronic post-traumatic stress disorder (PTSD) 03/30/2018 by Tresea Mall, CNM No   Genital herpes affecting pregnancy 03/30/2018 by Tresea Mall, CNM No   Supervision of high risk pregnancy, antepartum 03/30/2018 by Tresea Mall, CNM No   Overview Addendum 09/17/2018 10:31 AM by Nadara Mustard, MD    [facility] Prenatal Labs  Dating  8 wk [place] Blood type: O/Positive/-- (12/30 1157)   Genetic Screen   NIPS: normal XX Antibody:Negative (12/30 1157)  Anatomic [place] complete Rubella: 1.80 (12/30 1157) Varicella: Immune  GTT Early:   93  Third trimester: nml  RPR: Non Reactive (12/30 1157)   Rhogam  not needed HBsAg: Negative (12/30 1157)   TDaP vaccine       09/03/2018     Flu Shot:no HIV: Non Reactive (12/30 1157)   Baby Food  Breast                              GBS:   Contraception   BTL, papers  signed 5/29 Pap: NIL 2018  CBB  No   CS/VBAC NA   Support Person Casimiro NeedleMichael              Gestational age appropriate obstetric precautions including but not limited to vaginal bleeding, contractions, leaking of fluid and fetal movement were reviewed in detail with the patient.    Will obtain Preeclampsia labs today, only trace protein in office.  Discussed tylenol PRN for headache. If worsens or she has RUQ pain or vision changes advised to go to the ER. Will restart nifedipine. Reported that she to 90 mg in the past. Will restart on 30 mg.  Check BP in 1 week.  Start NSTs at next visit. Plan for NSTs: Weekly @ 32wks , twice weekly @ 36wks  Return in  about 1 week (around 09/29/2018) for ROB in person.  Natale Milchhristanna R Schuman MD [facility], [facility] 09/22/2018, 4:51 PM

## 2018-09-22 NOTE — Patient Instructions (Signed)
.  Preeclampsia and Eclampsia    Preeclampsia is a serious condition that may develop during pregnancy. It is also called toxemia of pregnancy. This condition causes high blood pressure along with other symptoms, such as swelling and headaches. These symptoms may develop as the condition gets worse. Preeclampsia may occur at 20 weeks of pregnancy or later.  Diagnosing and treating preeclampsia early is very important. If not treated early, it can cause serious problems for you and your baby. One problem it can lead to is eclampsia. Eclampsia is a condition that causes muscle jerking or shaking (convulsions or seizures) and other serious problems for the mother. During pregnancy, delivering your baby may be the best treatment for preeclampsia or eclampsia. For most women, preeclampsia and eclampsia symptoms go away after giving birth.  In rare cases, a woman may develop preeclampsia after giving birth (postpartum preeclampsia). This usually occurs within 48 hours after childbirth but may occur up to 6 weeks after giving birth.  What are the causes?  The cause of preeclampsia is not known.  What increases the risk?  The following risk factors make you more likely to develop preeclampsia:   Being pregnant for the first time.   Having had preeclampsia during a past pregnancy.   Having a family history of preeclampsia.   Having high blood pressure.   Being pregnant with more than one baby.   Being 35 or older.   Being African-American.   Having kidney disease or diabetes.   Having medical conditions such as lupus or blood diseases.   Being very overweight (obese).  What are the signs or symptoms?  The earliest signs of preeclampsia are:   High blood pressure.   Increased protein in your urine. Your health care provider will check for this at every visit before you give birth (prenatal visit).  Other symptoms that may develop as the condition gets worse include:   Severe headaches.   Sudden weight  gain.   Swelling of the hands, face, legs, and feet.   Nausea and vomiting.   Vision problems, such as blurred or double vision.   Numbness in the face, arms, legs, and feet.   Urinating less than usual.   Dizziness.   Slurred speech.   Abdominal pain, especially upper abdominal pain.   Convulsions or seizures.  How is this diagnosed?  There are no screening tests for preeclampsia. Your health care provider will ask you about symptoms and check for signs of preeclampsia during your prenatal visits. You may also have tests that include:   Urine tests.   Blood tests.   Checking your blood pressure.   Monitoring your baby's heart rate.   Ultrasound.  How is this treated?  You and your health care provider will determine the treatment approach that is best for you. Treatment may include:   Having more frequent prenatal exams to check for signs of preeclampsia, if you have an increased risk for preeclampsia.   Medicine to lower your blood pressure.   Staying in the hospital, if your condition is severe. There, treatment will focus on controlling your blood pressure and the amount of fluids in your body (fluid retention).   Taking medicine (magnesium sulfate) to prevent seizures. This may be given as an injection or through an IV.   Taking a low-dose aspirin during your pregnancy.   Delivering your baby early, if your condition gets worse. You may have your labor started with medicine (induced), or you may have a cesarean   delivery.  Follow these instructions at home:  Eating and drinking     Drink enough fluid to keep your urine pale yellow.   Avoid caffeine.  Lifestyle   Do not use any products that contain nicotine or tobacco, such as cigarettes and e-cigarettes. If you need help quitting, ask your health care provider.   Do not use alcohol or drugs.   Avoid stress as much as possible. Rest and get plenty of sleep.  General instructions   Take over-the-counter and prescription medicines only as  told by your health care provider.   When lying down, lie on your left side. This keeps pressure off your major blood vessels.   When sitting or lying down, raise (elevate) your feet. Try putting some pillows underneath your lower legs.   Exercise regularly. Ask your health care provider what kinds of exercise are best for you.   Keep all follow-up and prenatal visits as told by your health care provider. This is important.  How is this prevented?  There is no known way of preventing preeclampsia or eclampsia from developing. However, to lower your risk of complications and detect problems early:   Get regular prenatal care. Your health care provider may be able to diagnose and treat the condition early.   Maintain a healthy weight. Ask your health care provider for help managing weight gain during pregnancy.   Work with your health care provider to manage any long-term (chronic) health conditions you have, such as diabetes or kidney problems.   You may have tests of your blood pressure and kidney function after giving birth.   Your health care provider may have you take low-dose aspirin during your next pregnancy.  Contact a health care provider if:   You have symptoms that your health care provider told you may require more treatment or monitoring, such as:  ? Headaches.  ? Nausea or vomiting.  ? Abdominal pain.  ? Dizziness.  ? Light-headedness.  Get help right away if:   You have severe:  ? Abdominal pain.  ? Headaches that do not get better.  ? Dizziness.  ? Vision problems.  ? Confusion.  ? Nausea or vomiting.   You have any of the following:  ? A seizure.  ? Sudden, rapid weight gain.  ? Sudden swelling in your hands, ankles, or face.  ? Trouble moving any part of your body.  ? Numbness in any part of your body.  ? Trouble speaking.  ? Abnormal bleeding.   You faint.  Summary   Preeclampsia is a serious condition that may develop during pregnancy. It is also called toxemia of pregnancy.   This  condition causes high blood pressure along with other symptoms, such as swelling and headaches.   Diagnosing and treating preeclampsia early is very important. If not treated early, it can cause serious problems for you and your baby.   Get help right away if you have symptoms that your health care provider told you to watch for.  This information is not intended to replace advice given to you by your health care provider. Make sure you discuss any questions you have with your health care provider.  Document Released: 04/04/2000 Document Revised: 03/24/2017 Document Reviewed: 11/12/2015  Elsevier Interactive Patient Education  2019 [employer]

## 2018-09-22 NOTE — Progress Notes (Signed)
ROB C/o headaches, light headed no blurred vision, and swelling in the left foot

## 2018-09-23 LAB — COMPREHENSIVE METABOLIC PANEL
ALT: 21 IU/L (ref 0–32)
AST: 21 IU/L (ref 0–40)
Albumin/Globulin Ratio: 1.4 (ref 1.2–2.2)
Albumin: 3.7 g/dL — ABNORMAL LOW (ref 3.8–4.8)
Alkaline Phosphatase: 103 IU/L (ref 39–117)
BUN/Creatinine Ratio: 12 (ref 9–23)
BUN: 7 mg/dL (ref 6–20)
Bilirubin Total: 0.4 mg/dL (ref 0.0–1.2)
CO2: 19 mmol/L — ABNORMAL LOW (ref 20–29)
Calcium: 10.3 mg/dL — ABNORMAL HIGH (ref 8.7–10.2)
Chloride: 103 mmol/L (ref 96–106)
Creatinine, Ser: 0.58 mg/dL (ref 0.57–1.00)
GFR calc Af Amer: 141 mL/min/{1.73_m2} (ref >59)
GFR calc non Af Amer: 122 mL/min/{1.73_m2} (ref >59)
Globulin, Total: 2.7 g/dL (ref 1.5–4.5)
Glucose: 82 mg/dL (ref 65–99)
Potassium: 4.1 mmol/L (ref 3.5–5.2)
Sodium: 137 mmol/L (ref 134–144)
Total Protein: 6.4 g/dL (ref 6.0–8.5)

## 2018-09-23 LAB — CBC
Hematocrit: 30 % — ABNORMAL LOW (ref 34.0–46.6)
Hemoglobin: 11 g/dL — ABNORMAL LOW (ref 11.1–15.9)
MCH: 33.8 pg — ABNORMAL HIGH (ref 26.6–33.0)
MCHC: 36.7 g/dL — ABNORMAL HIGH (ref 31.5–35.7)
MCV: 92 fL (ref 79–97)
Platelets: 284 10*3/uL (ref 150–450)
RBC: 3.25 x10E6/uL — ABNORMAL LOW (ref 3.77–5.28)
RDW: 11.3 % — ABNORMAL LOW (ref 11.7–15.4)
WBC: 16.2 10*3/uL — ABNORMAL HIGH (ref 3.4–10.8)

## 2018-09-24 ENCOUNTER — Other Ambulatory Visit: Payer: Self-pay | Admitting: Obstetrics and Gynecology

## 2018-09-24 NOTE — Progress Notes (Signed)
Called and discussed labs with patient. She has a elevated WBC count. Notes minor symotoms like headache, constipation, low appetite. Mild cough. She denies fevers. She denies travel or work exposure has mostly been at home. Protein creatinine ratio has not resulted yet. She had a headache yesterday but it resolved with tylenol today. Asked to come to the hospital if headache returned or worsened.

## 2018-09-29 ENCOUNTER — Other Ambulatory Visit: Payer: Self-pay

## 2018-09-29 ENCOUNTER — Ambulatory Visit (INDEPENDENT_AMBULATORY_CARE_PROVIDER_SITE_OTHER): Payer: Medicaid Other | Admitting: Obstetrics & Gynecology

## 2018-09-29 ENCOUNTER — Encounter: Payer: Self-pay | Admitting: Obstetrics & Gynecology

## 2018-09-29 VITALS — BP 130/80 | Wt 227.0 lb

## 2018-09-29 DIAGNOSIS — Z3A34 34 weeks gestation of pregnancy: Secondary | ICD-10-CM

## 2018-09-29 DIAGNOSIS — O10919 Unspecified pre-existing hypertension complicating pregnancy, unspecified trimester: Secondary | ICD-10-CM

## 2018-09-29 DIAGNOSIS — O0990 Supervision of high risk pregnancy, unspecified, unspecified trimester: Secondary | ICD-10-CM

## 2018-09-29 DIAGNOSIS — O10913 Unspecified pre-existing hypertension complicating pregnancy, third trimester: Secondary | ICD-10-CM

## 2018-09-29 NOTE — Progress Notes (Signed)
  Subjective  Fetal Movement? yes Contractions? no Leaking Fluid? no Vaginal Bleeding? no Denies headache, blurry vision, CP, SOB, epigastric pain.  Min edema. Objective  BP 130/80   Wt 227 lb (103 kg)   LMP 01/20/2018   BMI 34.52 kg/m  General: NAD Pumonary: no increased work of breathing Abdomen: gravid, non-tender Extremities: no edema Psychiatric: mood appropriate, affect full  A NST procedure was performed with FHR monitoring and a normal baseline established, appropriate time of 20-40 minutes of evaluation, and accels >2 seen w 15x15 characteristics.  Results show a REACTIVE NST.   Assessment  33 y.o. [id number] at [redacted]w[redacted]d by  11/07/2018, by Ultrasound presenting for routine prenatal visit  Plan   Problem List Items Addressed This Visit      Cardiovascular and Mediastinum   Chronic hypertension affecting pregnancy   Relevant Orders   US OB Limited Procardia 30 mg daily, continue Monitor fo s/sx preeclampsia, discussed NST R today, weekly    Plan AF< and twice weekly NST at 36 weeks Delivery 37-39 weeks     Other   Supervision of high risk pregnancy, antepartum       PNV       [facility]    Other Visit Diagnoses    [redacted] weeks gestation of pregnancy           PTL precautions      pregnancy Problems (from 03/29/18 to present)    Problem Noted Resolved   Tobacco use during pregnancy    Advised to quit  No   HSV-2 infection complicating pregnancy, second trimester    Valtrex for prevention  No   Trichomonas vaginalis infection    Bo sx's, resolved  No   Chronic hypertension affecting pregnancy    See above  No   Chronic post-traumatic stress disorder (PTSD)  No   Genital herpes affecting pregnancy  No   Supervision of high risk pregnancy, antepartum  No   Overview Addendum 09/17/2018 10:31 AM by Gae Dry, MD    [facility] Prenatal Labs  Dating  8 wk [place] Blood type: O/Positive/-- (12/30 1157)   Genetic Screen   NIPS: normal XX Antibody:Negative (12/30 1157)   Anatomic [place] complete Rubella: 1.80 (12/30 1157) Varicella: Immune  GTT Early:   93  Third trimester: nml  RPR: Non Reactive (12/30 1157)   Rhogam  not needed HBsAg: Negative (12/30 1157)   TDaP vaccine       09/03/2018     Flu Shot:no HIV: Non Reactive (12/30 1157)   Baby Food  Breast                              GBS:   Contraception   BTL, papers signed 5/29 Pap: NIL 2018  CBB  No   CS/VBAC NA   Support Person Mohammed Kindle, MD, [facility], [facility] 09/29/2018  4:22 PM

## 2018-10-01 ENCOUNTER — Encounter: Payer: Medicaid Other | Admitting: Obstetrics and Gynecology

## 2018-10-03 IMAGING — MR MR KNEE*R* W/O CM
6 series · 40 of 40 positions shown · non-contrast
Comparison: None.

CLINICAL DATA: Medial right knee pain and swelling with popping x3
weeks. No history of surgery.

EXAM:
MRI OF THE RIGHT KNEE WITHOUT CONTRAST
TECHNIQUE: Multiplanar, multisequence MR imaging of the knee was performed. No
intravenous contrast was administered.

[Series 3: PD fat-sat · axial · 3.0mm · 0.70mm/px · z∈[-63,+49]mm · 7 of 35 slices shown (1 of 4)]
[im 1/35]
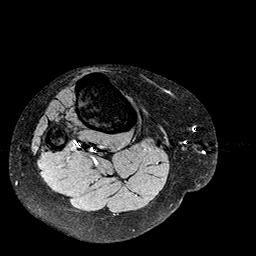
[im 6/35]
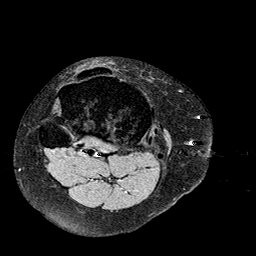
[im 12/35]
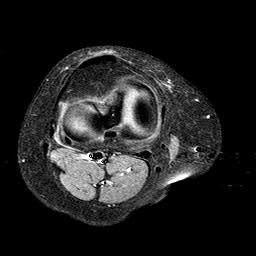
[im 18/35]
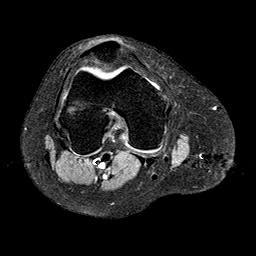
[im 23/35]
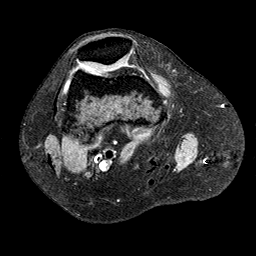
[im 29/35]
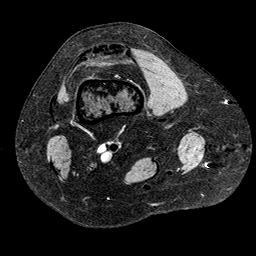
[im 35/35]
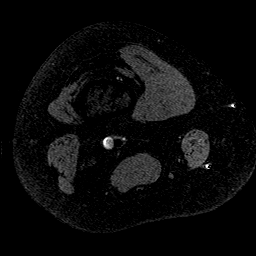

[Series 4: T1 · coronal · 3.0mm · 0.70mm/px · 7 of 31 slices shown]
[im 1/31]
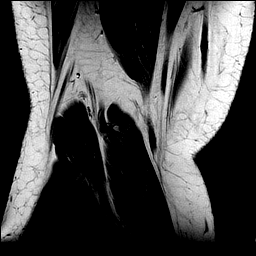
[im 6/31]
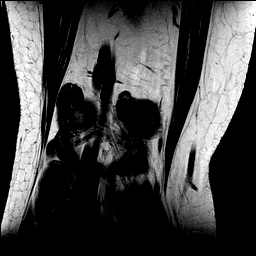
[im 11/31]
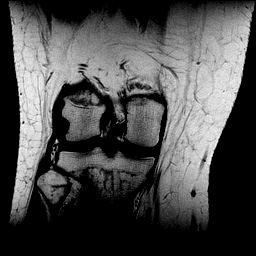
[im 16/31]
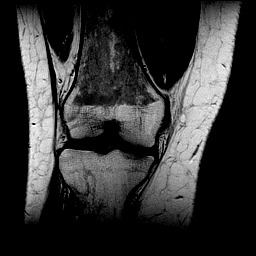
[im 21/31]
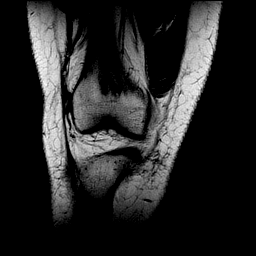
[im 26/31]
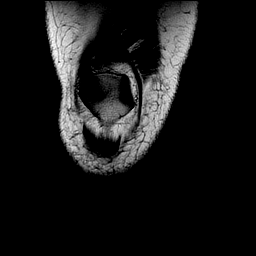
[im 31/31]
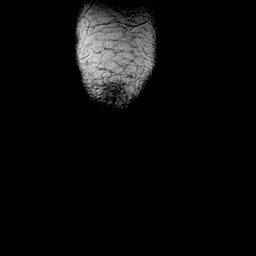

[Series 5: PD fat-sat · sagittal · 3.0mm · 0.70mm/px · 8 of 35 slices shown (2 of 4)]
[im 1/35]
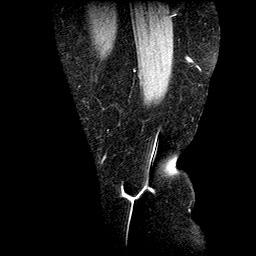
[im 5/35]
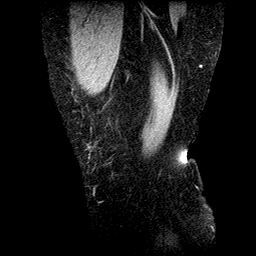
[im 10/35]
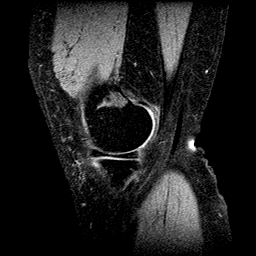
[im 15/35]
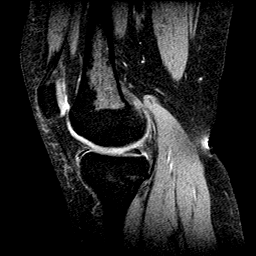
[im 20/35]
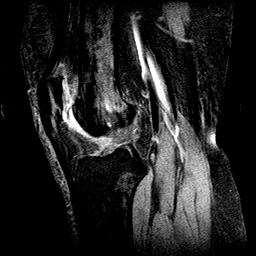
[im 25/35]
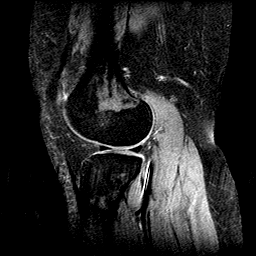
[im 30/35]
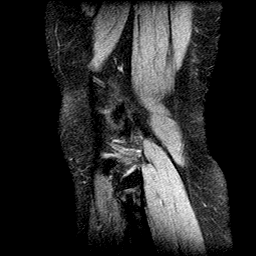
[im 35/35]
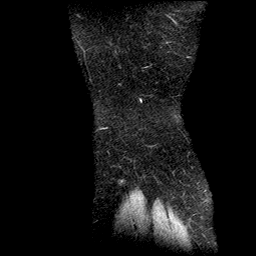

[Series 6: T2 fat-sat · coronal · 3.0mm · 0.70mm/px · 7 of 31 slices shown]
[im 1/31]
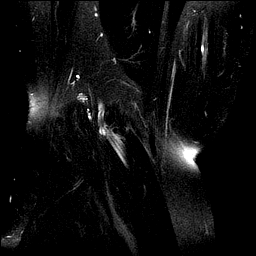
[im 6/31]
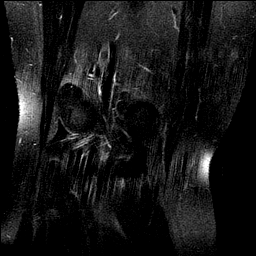
[im 11/31]
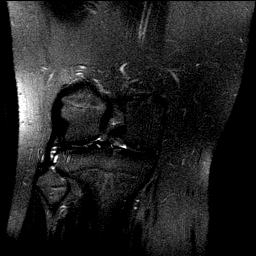
[im 16/31]
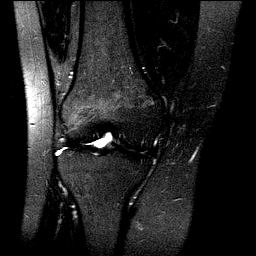
[im 21/31]
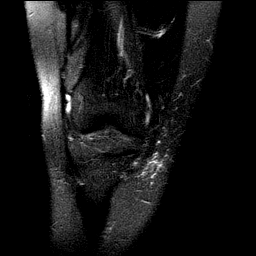
[im 26/31]
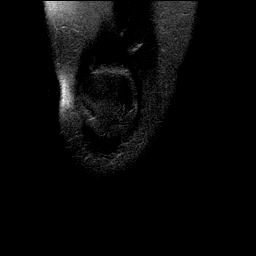
[im 31/31]
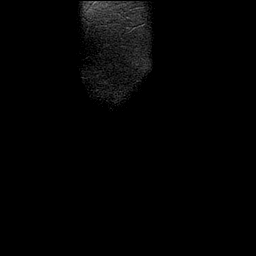

[Series 7: PD fat-sat · coronal · 3.0mm · 0.70mm/px · 7 of 31 slices shown (3 of 4)]
[im 1/31]
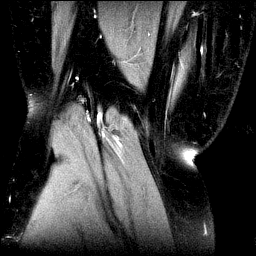
[im 6/31]
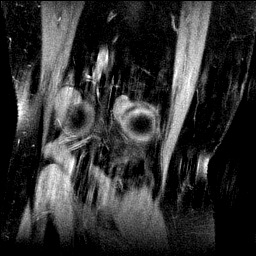
[im 11/31]
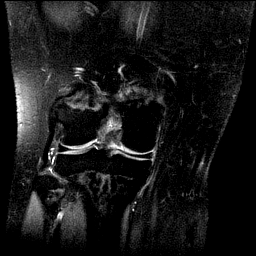
[im 16/31]
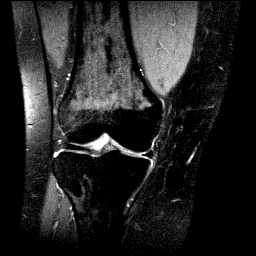
[im 21/31]
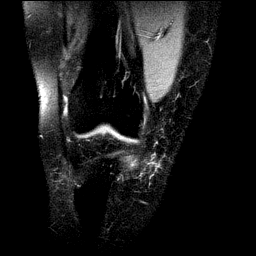
[im 26/31]
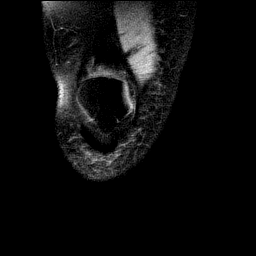
[im 31/31]
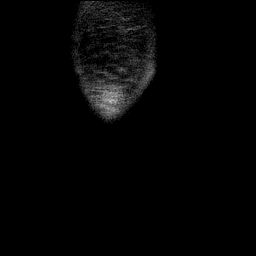

[Series 8: PD fat-sat · coronal · 2.0mm · 0.31mm/px · 4 of 19 slices shown (4 of 4)]
[im 1/19]
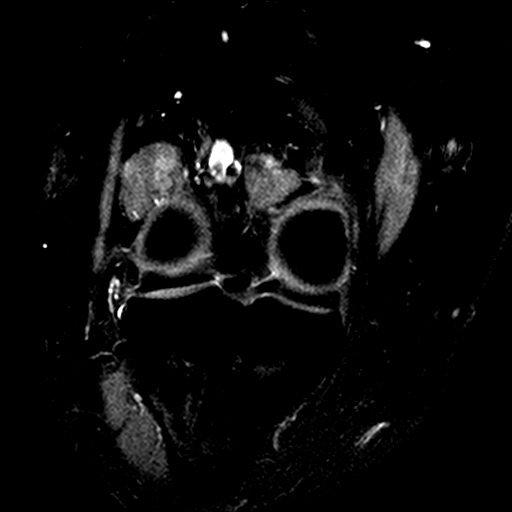
[im 7/19]
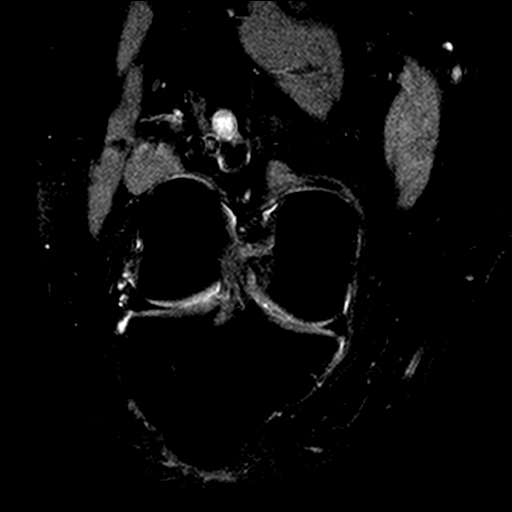
[im 13/19]
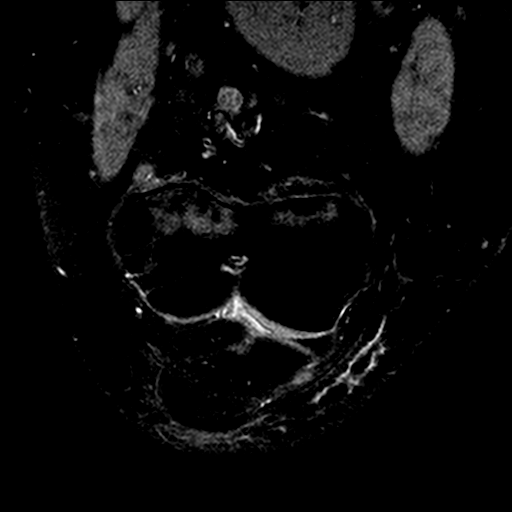
[im 19/19]
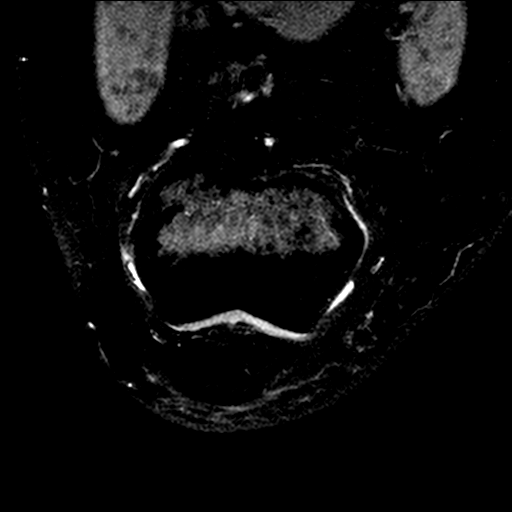

[40 of 40 positions shown; findings below may reference images not displayed]

FINDINGS: MENISCI

Medial meniscus:  Intact.

Lateral meniscus:  Intact.

LIGAMENTS

Cruciates:  Intact ACL and PCL.

Collaterals: Medial collateral ligament is intact. Lateral
collateral ligament complex is intact.

CARTILAGE

Patellofemoral:  No chondral defect.

Medial: Mild partial-thickness cartilage loss of the medial
femorotibial compartment.

Lateral:  No chondral defect.

Joint:  No joint effusion. Normal Hoffa's fat. No plical thickening.

Popliteal Fossa:  No Baker cyst. Intact popliteus tendon.

Extensor Mechanism:  Intact quadriceps tendon and patellar tendon.

Bones:  No marrow edema or fracture.

Other: None.
IMPRESSION: Intact menisci, cruciate and collateral ligaments.

No acute marrow signal abnormalities to suggest fracture or bone
contusion.

## 2018-10-06 ENCOUNTER — Ambulatory Visit (INDEPENDENT_AMBULATORY_CARE_PROVIDER_SITE_OTHER): Payer: Medicaid Other | Admitting: Certified Nurse Midwife

## 2018-10-06 ENCOUNTER — Ambulatory Visit (INDEPENDENT_AMBULATORY_CARE_PROVIDER_SITE_OTHER): Payer: Medicaid Other

## 2018-10-06 ENCOUNTER — Other Ambulatory Visit: Payer: Self-pay

## 2018-10-06 VITALS — BP 118/60 | Wt 226.0 lb

## 2018-10-06 DIAGNOSIS — O10919 Unspecified pre-existing hypertension complicating pregnancy, unspecified trimester: Secondary | ICD-10-CM

## 2018-10-06 DIAGNOSIS — O10913 Unspecified pre-existing hypertension complicating pregnancy, third trimester: Secondary | ICD-10-CM

## 2018-10-06 DIAGNOSIS — O0993 Supervision of high risk pregnancy, unspecified, third trimester: Secondary | ICD-10-CM

## 2018-10-06 DIAGNOSIS — Z3A35 35 weeks gestation of pregnancy: Secondary | ICD-10-CM

## 2018-10-06 DIAGNOSIS — O0990 Supervision of high risk pregnancy, unspecified, unspecified trimester: Secondary | ICD-10-CM

## 2018-10-06 LAB — POCT URINALYSIS DIPSTICK OB: Glucose, UA: NEGATIVE

## 2018-10-06 LAB — FETAL NONSTRESS TEST

## 2018-10-06 NOTE — Progress Notes (Signed)
States she has been having pain that starts in the vaginal area & runs down her inner right thigh.

## 2018-10-07 LAB — PROTEIN / CREATININE RATIO, URINE
Creatinine, Urine: 117.1 mg/dL
Protein, Ur: 29.9 mg/dL
Protein/Creat Ratio: 255 mg/g creat — ABNORMAL HIGH (ref 0–200)

## 2018-10-07 NOTE — Progress Notes (Signed)
HROB at 35wk 3 days: CHN on Procardia 30 mgm XL daily. Here for NST/AFI/ROB Reports good FM. Taking Valtrex once a day. No regular contractions, leakage of fluid or vaginal bleeding. Complains of shooting pains in vaginal area and inner right thigh especially at the end of the day/ and at hs-discussed comfort measures NST today reactive with baseline 135-140 and accelerations to 150s to 160, moderate variability AFI: 47.0LK/ cephalic Last growth scan 5/15 EFW 43.5% (3#7oz) NST/growth scan 25 June Start Valtrex 500  BID at 36 weeks Labor precautions  Dalia Heading, CNM

## 2018-10-08 ENCOUNTER — Other Ambulatory Visit: Payer: Self-pay | Admitting: Certified Nurse Midwife

## 2018-10-08 DIAGNOSIS — O10919 Unspecified pre-existing hypertension complicating pregnancy, unspecified trimester: Secondary | ICD-10-CM

## 2018-10-14 ENCOUNTER — Other Ambulatory Visit: Payer: Self-pay

## 2018-10-14 ENCOUNTER — Ambulatory Visit (INDEPENDENT_AMBULATORY_CARE_PROVIDER_SITE_OTHER): Payer: Medicaid Other | Admitting: Advanced Practice Midwife

## 2018-10-14 ENCOUNTER — Other Ambulatory Visit (HOSPITAL_COMMUNITY)
Admission: RE | Admit: 2018-10-14 | Discharge: 2018-10-14 | Disposition: A | Discharge status: Home / Self Care | Payer: Medicaid Other | Source: Ambulatory Visit | Attending: Advanced Practice Midwife | Admitting: Advanced Practice Midwife

## 2018-10-14 ENCOUNTER — Other Ambulatory Visit: Payer: Self-pay | Admitting: Advanced Practice Midwife

## 2018-10-14 ENCOUNTER — Ambulatory Visit (INDEPENDENT_AMBULATORY_CARE_PROVIDER_SITE_OTHER): Payer: Medicaid Other

## 2018-10-14 ENCOUNTER — Encounter: Payer: Self-pay | Admitting: Advanced Practice Midwife

## 2018-10-14 VITALS — BP 118/62 | Wt 227.0 lb

## 2018-10-14 DIAGNOSIS — O403XX0 Polyhydramnios, third trimester, not applicable or unspecified: Secondary | ICD-10-CM | POA: Diagnosis not present

## 2018-10-14 DIAGNOSIS — Z3A36 36 weeks gestation of pregnancy: Secondary | ICD-10-CM

## 2018-10-14 DIAGNOSIS — Z3685 Encounter for antenatal screening for Streptococcus B: Secondary | ICD-10-CM

## 2018-10-14 DIAGNOSIS — Z113 Encounter for screening for infections with a predominantly sexual mode of transmission: Secondary | ICD-10-CM | POA: Diagnosis present

## 2018-10-14 DIAGNOSIS — O0993 Supervision of high risk pregnancy, unspecified, third trimester: Secondary | ICD-10-CM

## 2018-10-14 DIAGNOSIS — O0990 Supervision of high risk pregnancy, unspecified, unspecified trimester: Secondary | ICD-10-CM

## 2018-10-14 DIAGNOSIS — O10913 Unspecified pre-existing hypertension complicating pregnancy, third trimester: Secondary | ICD-10-CM

## 2018-10-14 DIAGNOSIS — O10919 Unspecified pre-existing hypertension complicating pregnancy, unspecified trimester: Secondary | ICD-10-CM

## 2018-10-14 DIAGNOSIS — O133 Gestational [pregnancy-induced] hypertension without significant proteinuria, third trimester: Secondary | ICD-10-CM

## 2018-10-14 HISTORY — DX: Polyhydramnios, third trimester, not applicable or unspecified: O40.3XX0

## 2018-10-14 LAB — FETAL NONSTRESS TEST

## 2018-10-14 NOTE — Patient Instructions (Signed)
Braxton Hicks Contractions Contractions of the uterus can occur throughout pregnancy, but they are not always a sign that you are in labor. You may have practice contractions called Braxton Hicks contractions. These false labor contractions are sometimes confused with true labor. What are Braxton Hicks contractions? Braxton Hicks contractions are tightening movements that occur in the muscles of the uterus before labor. Unlike true labor contractions, these contractions do not result in opening (dilation) and thinning of the cervix. Toward the end of pregnancy (32-34 weeks), Braxton Hicks contractions can happen more often and may become stronger. These contractions are sometimes difficult to tell apart from true labor because they can be very uncomfortable. You should not feel embarrassed if you go to the hospital with false labor. Sometimes, the only way to tell if you are in true labor is for your health care provider to look for changes in the cervix. The health care provider will do a physical exam and may monitor your contractions. If you are not in true labor, the exam should show that your cervix is not dilating and your water has not broken. If there are no other health problems associated with your pregnancy, it is completely safe for you to be sent home with false labor. You may continue to have Braxton Hicks contractions until you go into true labor. How to tell the difference between true labor and false labor True labor  Contractions last 30-70 seconds.  Contractions become very regular.  Discomfort is usually felt in the top of the uterus, and it spreads to the lower abdomen and low back.  Contractions do not go away with walking.  Contractions usually become more intense and increase in frequency.  The cervix dilates and gets thinner. False labor  Contractions are usually shorter and not as strong as true labor contractions.  Contractions are usually irregular.  Contractions  are often felt in the front of the lower abdomen and in the groin.  Contractions may go away when you walk around or change positions while lying down.  Contractions get weaker and are shorter-lasting as time goes on.  The cervix usually does not dilate or become thin. Follow these instructions at home:   Take over-the-counter and prescription medicines only as told by your health care provider.  Keep up with your usual exercises and follow other instructions from your health care provider.  Eat and drink lightly if you think you are going into labor.  If Braxton Hicks contractions are making you uncomfortable: ? Change your position from lying down or resting to walking, or change from walking to resting. ? Sit and rest in a tub of warm water. ? Drink enough fluid to keep your urine pale yellow. Dehydration may cause these contractions. ? Do slow and deep breathing several times an hour.  Keep all follow-up prenatal visits as told by your health care provider. This is important. Contact a health care provider if:  You have a fever.  You have continuous pain in your abdomen. Get help right away if:  Your contractions become stronger, more regular, and closer together.  You have fluid leaking or gushing from your vagina.  You pass blood-tinged mucus (bloody show).  You have bleeding from your vagina.  You have low back pain that you never had before.  You feel your baby's head pushing down and causing pelvic pressure.  Your baby is not moving inside you as much as it used to. Summary  Contractions that occur before labor are   called Braxton Hicks contractions, false labor, or practice contractions.  Braxton Hicks contractions are usually shorter, weaker, farther apart, and less regular than true labor contractions. True labor contractions usually become progressively stronger and regular, and they become more frequent.  Manage discomfort from Braxton Hicks contractions  by changing position, resting in a warm bath, drinking plenty of water, or practicing deep breathing. This information is not intended to replace advice given to you by your health care provider. Make sure you discuss any questions you have with your health care provider. Document Released: 08/21/2016 Document Revised: 01/20/2017 Document Reviewed: 08/21/2016 Elsevier Interactive Patient Education  2019 [employer]  

## 2018-10-14 NOTE — Progress Notes (Signed)
ROB/US/NST GBS/Aptima  C/o Braxton hicks, pain and pressure  Denies lof, no vb, Good FM

## 2018-10-14 NOTE — Progress Notes (Signed)
Routine Prenatal Care Visit  Subjective  Karen Wallace is a 33 y.o. [phone] at 4669w4d being seen today for ongoing prenatal care.  She is currently monitored for the following issues for this high-risk pregnancy and has Chronic hypertension affecting pregnancy; Chronic post-traumatic stress disorder (PTSD); Genital herpes affecting pregnancy; Supervision of high risk pregnancy, antepartum; Trichomonas vaginalis infection; RLQ abdominal pain; Tobacco abuse counseling; HSV-2 infection complicating pregnancy, second trimester; Depression; Anxiety; Tobacco use during pregnancy; Genitourinary infection, candidal; and Polyhydramnios affecting pregnancy in third trimester on their problem list.  ----------------------------------------------------------------------------------- Patient reports braxton hicks ctxs and pelvic pressure.   Contractions: Not present. Vag. Bleeding: None.  Movement: Present. Denies leaking of fluid.  ----------------------------------------------------------------------------------- The following portions of the patient's history were reviewed and updated as appropriate: allergies, current medications, past family history, past medical history, past social history, past surgical history and problem list. Problem list updated.   Objective  Blood pressure 118/62, weight 227 lb (103 kg), last menstrual period 01/20/2018. Pregravid weight 215 lb (97.5 kg) Total Weight Gain 12 lb (5.443 kg) Urinalysis: Urine Protein    Urine Glucose    Fetal Status: Fetal Heart Rate (bpm): 145   Movement: Present      Patient Name: Karen Wallace DOB: 01-Sep-1985 MRN: [id number]  ULTRASOUND REPORT  Location: [facility] Date of Service: 10/14/2018   Indications:growth/afi Findings:  Mason JimSingleton intrauterine pregnancy is visualized with FHR at 156 BPM. Biometrics give an (U/S) Gestational age of 5447w6d and an (U/S) EDD of 11/05/2018; this correlates with the clinically established  Estimated Date of Delivery: 11/07/18.  Fetal presentation is Cephalic.  Placenta:  Anterior fundal. Grade: 1 AFI: 25.5 cm  Growth percentile is 64.9%. EFW: 3163g (7 lb 0 oz)  Impression: 1. 6369w4d Viable Singleton Intrauterine pregnancy previously established criteria. 2. Growth is 64.9 %ile.  AFI is 25.5 cm.  3. Polyhydramnios  Recommendations: 1.Clinical correlation with the patient's History and Physical Exam.  Deanna ArtisElyse S Fairbanks, RT  General:  Alert, oriented and cooperative. Patient is in no acute distress.  Skin: Skin is warm and dry. No rash noted.   Cardiovascular: Normal heart rate noted  Respiratory: Normal respiratory effort, no problems with respiration noted  Abdomen: Soft, gravid, appropriate for gestational age. Pain/Pressure: Present     Pelvic:  Cervical exam performed Dilation: Fingertip Effacement (%): 40 Station: -3  Extremities: Normal range of motion.  Edema: None  Mental Status: Normal mood and affect. Normal behavior. Normal judgment and thought content.   Assessment   33 y.o. [id number] at 7669w4d by  11/07/2018, by Ultrasound presenting for routine prenatal visit  Plan   pregnancy Problems (from 03/29/18 to present)    Problem Noted Resolved   Tobacco use during pregnancy 07/15/2018 by Natale MilchSchuman, Christanna R, MD No   HSV-2 infection complicating pregnancy, second trimester 06/09/2018 by Nadara MustardHarris, Robert P, MD No   Trichomonas vaginalis infection 04/06/2018 by Tresea MallGledhill, Kewon Statler, CNM No   Chronic hypertension affecting pregnancy 03/30/2018 by Tresea MallGledhill, Arliss Hepburn, CNM No   Chronic post-traumatic stress disorder (PTSD) 03/30/2018 by Tresea MallGledhill, Ghazi Rumpf, CNM No   Genital herpes affecting pregnancy 03/30/2018 by Tresea MallGledhill, Tejal Monroy, CNM No   Supervision of high risk pregnancy, antepartum 03/30/2018 by Tresea MallGledhill, Dawne Casali, CNM No   Overview Addendum 09/17/2018 10:31 AM by Nadara MustardHarris, Robert P, MD    Clinic [facility] Prenatal Labs  Dating  8 wk US Blood type: O/Positive/-- (12/30 1157)    Genetic Screen   NIPS: normal XX Antibody:Negative (12/30 1157)  Anatomic US complete Rubella:  1.80 (12/30 1157) Varicella: Immune  GTT Early:   93  Third trimester: nml  RPR: Non Reactive (12/30 1157)   Rhogam  not needed HBsAg: Negative (12/30 1157)   TDaP vaccine       09/03/2018     Flu Shot:no HIV: Non Reactive (12/30 1157)   Baby Food  Breast                              GBS:   Contraception   BTL, papers signed 5/29 Pap: NIL 2018  CBB  No   CS/VBAC NA   Support Person Legrand Como              Preterm labor symptoms and general obstetric precautions including but not limited to vaginal bleeding, contractions, leaking of fluid and fetal movement were reviewed in detail with the patient. Please refer to After Visit Summary for other counseling recommendations.   Return in about 4 days (around 10/18/2018) for nst/rob.  Rod Can, CNM 10/14/2018 1:38 PM

## 2018-10-16 LAB — STREP GP B NAA: Strep Gp B NAA: NEGATIVE

## 2018-10-18 ENCOUNTER — Ambulatory Visit (INDEPENDENT_AMBULATORY_CARE_PROVIDER_SITE_OTHER): Payer: Medicaid Other | Admitting: Maternal Newborn

## 2018-10-18 ENCOUNTER — Observation Stay
Admission: EM | Admit: 2018-10-18 | Discharge: 2018-10-18 | Disposition: A | Discharge status: Home / Self Care | Payer: Medicaid Other | Attending: Obstetrics and Gynecology | Admitting: Obstetrics and Gynecology

## 2018-10-18 ENCOUNTER — Encounter: Payer: Self-pay | Admitting: Maternal Newborn

## 2018-10-18 ENCOUNTER — Other Ambulatory Visit: Payer: Self-pay

## 2018-10-18 VITALS — BP 128/78 | Wt 226.8 lb

## 2018-10-18 DIAGNOSIS — O163 Unspecified maternal hypertension, third trimester: Secondary | ICD-10-CM | POA: Diagnosis not present

## 2018-10-18 DIAGNOSIS — B009 Herpesviral infection, unspecified: Secondary | ICD-10-CM

## 2018-10-18 DIAGNOSIS — Z3689 Encounter for other specified antenatal screening: Secondary | ICD-10-CM

## 2018-10-18 DIAGNOSIS — O409XX0 Polyhydramnios, unspecified trimester, not applicable or unspecified: Secondary | ICD-10-CM

## 2018-10-18 DIAGNOSIS — O0990 Supervision of high risk pregnancy, unspecified, unspecified trimester: Secondary | ICD-10-CM

## 2018-10-18 DIAGNOSIS — Z888 Allergy status to other drugs, medicaments and biological substances status: Secondary | ICD-10-CM | POA: Diagnosis not present

## 2018-10-18 DIAGNOSIS — A599 Trichomoniasis, unspecified: Secondary | ICD-10-CM

## 2018-10-18 DIAGNOSIS — K219 Gastro-esophageal reflux disease without esophagitis: Secondary | ICD-10-CM | POA: Insufficient documentation

## 2018-10-18 DIAGNOSIS — Z79899 Other long term (current) drug therapy: Secondary | ICD-10-CM | POA: Insufficient documentation

## 2018-10-18 DIAGNOSIS — F419 Anxiety disorder, unspecified: Secondary | ICD-10-CM | POA: Insufficient documentation

## 2018-10-18 DIAGNOSIS — Z3A37 37 weeks gestation of pregnancy: Secondary | ICD-10-CM | POA: Insufficient documentation

## 2018-10-18 DIAGNOSIS — F329 Major depressive disorder, single episode, unspecified: Secondary | ICD-10-CM | POA: Insufficient documentation

## 2018-10-18 DIAGNOSIS — O99343 Other mental disorders complicating pregnancy, third trimester: Secondary | ICD-10-CM | POA: Diagnosis not present

## 2018-10-18 DIAGNOSIS — O288 Other abnormal findings on antenatal screening of mother: Secondary | ICD-10-CM | POA: Diagnosis not present

## 2018-10-18 DIAGNOSIS — O99333 Smoking (tobacco) complicating pregnancy, third trimester: Secondary | ICD-10-CM | POA: Diagnosis not present

## 2018-10-18 DIAGNOSIS — F17210 Nicotine dependence, cigarettes, uncomplicated: Secondary | ICD-10-CM | POA: Insufficient documentation

## 2018-10-18 DIAGNOSIS — O0993 Supervision of high risk pregnancy, unspecified, third trimester: Secondary | ICD-10-CM

## 2018-10-18 DIAGNOSIS — O99613 Diseases of the digestive system complicating pregnancy, third trimester: Secondary | ICD-10-CM | POA: Insufficient documentation

## 2018-10-18 DIAGNOSIS — A6009 Herpesviral infection of other urogenital tract: Secondary | ICD-10-CM

## 2018-10-18 DIAGNOSIS — Z8249 Family history of ischemic heart disease and other diseases of the circulatory system: Secondary | ICD-10-CM | POA: Diagnosis not present

## 2018-10-18 DIAGNOSIS — O10913 Unspecified pre-existing hypertension complicating pregnancy, third trimester: Secondary | ICD-10-CM | POA: Diagnosis not present

## 2018-10-18 DIAGNOSIS — O10919 Unspecified pre-existing hypertension complicating pregnancy, unspecified trimester: Secondary | ICD-10-CM

## 2018-10-18 DIAGNOSIS — F4312 Post-traumatic stress disorder, chronic: Secondary | ICD-10-CM

## 2018-10-18 DIAGNOSIS — O98512 Other viral diseases complicating pregnancy, second trimester: Secondary | ICD-10-CM

## 2018-10-18 LAB — POCT URINALYSIS DIPSTICK OB
Glucose, UA: NEGATIVE
POC,PROTEIN,UA: NEGATIVE

## 2018-10-18 NOTE — Discharge Summary (Addendum)
Physician Final Progress Note  Patient ID: Karen AbbottSherita D Rhue MRN: [id number] DOB/AGE: 1986/04/08 33 y.o.  Admit date: 10/18/2018 Admitting provider: Conard NovakStephen D Jackson, MD Discharge date: 10/18/2018   Admission Diagnoses: non reactive NST in office  Discharge Diagnoses:  Active Problems:   Indication for care in labor and delivery, antepartum   NST (non-stress test) reactive IUP at 37 weeks  History of Present Illness: The patient is a 33 y.o. female [phone] at 6148w1d who presents from the office for non-reactive NST in the office. Patient is seen twice weekly for AFI/NST due to Hypertension on anti-hypertensive therapy. She is taking her medication as prescribed. She has no other concerns at this time.    She denies any s/s of illness. She denies headache, visual changes, epigastric pain, leaking fluid or vaginal bleeding. She admits irregular braxton hicks contractions for the past few days.   It is possible that maternal heart rate was being traced in the office as heart rate was in the 110s without accelerations despite vibro-acoustic stimulation. Maternal heart rate was found to be in the 110s while in triage. Fetal heart rate reassuring with reactive NST in triage.   Past Medical History:  Diagnosis Date  . Abnormal Pap smear of cervix    2006/2010  . Anxiety   . Depression   . 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 GERD (gastroesophageal reflux disease)    OTC GUMMIES PRN  . Heart murmur    AS A  CHILD  . Hypertension   . Indication for care in labor and delivery, antepartum 05/23/2015  . Postpartum care following vaginal delivery 05/25/2015  . Preterm contractions 04/11/2015    Past Surgical History:  Procedure Laterality Date  . APPENDECTOMY  april 2016  . CHOLECYSTECTOMY N/A 11/19/2016   Procedure: LAPAROSCOPIC CHOLECYSTECTOMY;  Surgeon: Henrene DodgePiscoya, Jose, MD;  Location: [facility] ORS;  Service: General;  Laterality: N/A;  . colposcopy of cervix     2007/2010  .  DILATION AND CURETTAGE OF UTERUS      No current facility-administered medications on file prior to encounter.    Current Outpatient Medications on File Prior to Encounter  Medication Sig Dispense Refill  . busPIRone (BUSPAR) 10 MG tablet TK 1/2 TO 1 T PO BID FOR MOOD  0  . NIFEdipine (PROCARDIA-XL/NIFEDICAL-XL) 30 MG 24 hr tablet Take 1 tablet (30 mg total) by mouth daily. Can increase to twice a day as needed for symptomatic contractions 30 tablet 2  . Prenat w/o A Vit-FeFum-FePo-FA (PROVIDA OB) 20-20-1.25 MG CAPS Take 1 tablet by mouth daily. 30 capsule 11  . valACYclovir (VALTREX) 500 MG tablet Take 1 tablet (500 mg total) by mouth daily. 30 tablet 5    Allergies  Allergen Reactions  . Lisinopril Cough    Social History   Socioeconomic History  . Marital status: Single    Spouse name: Not on file  . Number of children: Not on file  . Years of education: Not on file  . Highest education level: Not on file  Occupational History  . Not on file  Social Needs  . Financial resource strain: Not on file  . Food insecurity    Worry: Not on file    Inability: Not on file  . Transportation needs    Medical: Not on file    Non-medical: Not on file  Tobacco Use  . Smoking status: Current Every Day Smoker    Packs/day: 0.25    Years: 10.00  Pack years: 2.50    Types: Cigarettes  . Smokeless tobacco: Never Used  Substance and Sexual Activity  . Alcohol use: Not Currently    Comment: OCC  . Drug use: No  . Sexual activity: Yes    Birth control/protection: Surgical  Lifestyle  . Physical activity    Days per week: Not on file    Minutes per session: Not on file  . Stress: Not on file  Relationships  . Social Herbalist on phone: Not on file    Gets together: Not on file    Attends religious service: Not on file    Active member of club or organization: Not on file    Attends meetings of clubs or organizations: Not on file    Relationship status: Not on file   . Intimate partner violence    Fear of current or ex partner: Not on file    Emotionally abused: Not on file    Physically abused: Not on file    Forced sexual activity: Not on file  Other Topics Concern  . Not on file  Social History Narrative  . Not on file    Family History  Problem Relation Age of Onset  . Hypertension Mother   . Depression Mother   . Hypertension Father      Review of Systems  Constitutional: Negative.   HENT: Negative.   Eyes: Negative.   Respiratory: Negative.   Cardiovascular: Negative.   Gastrointestinal: Negative.   Genitourinary: Negative.   Musculoskeletal: Negative.   Skin: Negative.   Neurological: Negative.   Endo/Heme/Allergies: Negative.   Psychiatric/Behavioral: Negative.      Physical Exam: BP 130/72 (BP Location: Left Arm)   Pulse (!) 111   Temp 98.4 F (36.9 C) (Oral)   Resp 17   Ht 5\' 8"  (1.727 m)   Wt 102.5 kg   LMP 01/20/2018   BMI 34.36 kg/m   Constitutional: Well nourished, well developed female in no acute distress.  HEENT: normal Skin: Warm and dry.  Cardiovascular: Regular rate and rhythm.   Extremity: no edema  Respiratory: Clear to auscultation bilateral. Normal respiratory effort Abdomen: FHT present Back: no CVAT Neuro: DTRs 2+, Cranial nerves grossly intact Psych: Alert and Oriented x3. No memory deficits. Normal mood and affect.  MS: normal gait, normal bilateral lower extremity ROM/strength/stability.  Toco: rare contraction Fetal well being: 135 bpm baseline, moderate variability, +accelerations to 160, -decelerations  Pelvic exam: deferred   Consults: None  Significant Findings/ Diagnostic Studies: none  Procedures: NST  Hospital Course: The patient was admitted to Labor and Delivery Triage for observation.   Discharge Condition: good  Disposition: Discharge disposition: 01-Home or Self Care      Diet: Regular diet  Discharge Activity: Activity as tolerated  Discharge  Instructions    Discharge activity:  No Restrictions   Complete by: As directed    Discharge diet:  No restrictions   Complete by: As directed    Fetal Kick Count:  Lie on our left side for one hour after a meal, and count the number of times your baby kicks.  If it is less than 5 times, get up, move around and drink some juice.  Repeat the test 30 minutes later.  If it is still less than 5 kicks in an hour, notify your doctor.   Complete by: As directed    LABOR:  When conractions begin, you should start to time them from the beginning of  one contraction to the beginning  of the next.  When contractions are 5 - 10 minutes apart or less and have been regular for at least an hour, you should call your health care provider.   Complete by: As directed    No sexual activity restrictions   Complete by: As directed    Notify physician for bleeding from the vagina   Complete by: As directed    Notify physician for blurring of vision or spots before the eyes   Complete by: As directed    Notify physician for chills or fever   Complete by: As directed    Notify physician for fainting spells, "black outs" or loss of consciousness   Complete by: As directed    Notify physician for increase in vaginal discharge   Complete by: As directed    Notify physician for leaking of fluid   Complete by: As directed    Notify physician for pain or burning when urinating   Complete by: As directed    Notify physician for pelvic pressure (sudden increase)   Complete by: As directed    Notify physician for severe or continued nausea or vomiting   Complete by: As directed    Notify physician for sudden gushing of fluid from the vagina (with or without continued leaking)   Complete by: As directed    Notify physician for sudden, constant, or occasional abdominal pain   Complete by: As directed    Notify physician if baby moving less than usual   Complete by: As directed      Allergies as of 10/18/2018       Reactions   Lisinopril Cough      Medication List    TAKE these medications   busPIRone 10 MG tablet Commonly known as: BUSPAR TK 1/2 TO 1 T PO BID FOR MOOD   NIFEdipine 30 MG 24 hr tablet Commonly known as: PROCARDIA-XL/NIFEDICAL-XL Take 1 tablet (30 mg total) by mouth daily. Can increase to twice a day as needed for symptomatic contractions   Provida OB 20-20-1.25 MG Caps Take 1 tablet by mouth daily.   valACYclovir 500 MG tablet Commonly known as: VALTREX Take 1 tablet (500 mg total) by mouth daily.      Follow-up Information    [facility]. Go to.   Specialty: Obstetrics and Gynecology Why: regular scheduled prenatal appointment Contact information: [address] [place] [phone]          Total time spent taking care of this patient: 15 minutes  Signed: Tresea MallJane Crickett Abbett, CNM  10/18/2018, 12:44 PM

## 2018-10-18 NOTE — OB Triage Note (Signed)
Pt G4P2 [redacted]w[redacted]d presents from the office d/t non reactive NST. Pt denies LOF/bleeding. Pt reports having braxton hicks for the past few days that come and go. Pt has no concerns at this time. Monitors applied. VSS. CNM notified of pt.

## 2018-10-18 NOTE — Progress Notes (Signed)
ROB/NST- Braxtion Hicks contractions for the last 3-4 days

## 2018-10-18 NOTE — Progress Notes (Signed)
Routine Prenatal Care Visit  Subjective  Karen Wallace is a 33 y.o. [phone] at [redacted]w[redacted]d being seen today for ongoing prenatal care.  She is currently monitored for the following issues for this high-risk pregnancy and has Chronic hypertension affecting pregnancy; Chronic post-traumatic stress disorder (PTSD); Genital herpes affecting pregnancy; Supervision of high risk pregnancy, antepartum; Trichomonas vaginalis infection; RLQ abdominal pain; Tobacco abuse counseling; HSV-2 infection complicating pregnancy, second trimester; Depression; Anxiety; Tobacco use during pregnancy; Genitourinary infection, candidal; Polyhydramnios affecting pregnancy in third trimester; and Indication for care in labor and delivery, antepartum on their problem list.  ----------------------------------------------------------------------------------- Patient reports Braxton Hicks contractions over the past few days. She has been moving to a different place and has been on her feet a lot.   Contractions: Not present. Vag. Bleeding: None.  Movement: Present. No leaking of fluid.  ----------------------------------------------------------------------------------- The following portions of the patient's history were reviewed and updated as appropriate: allergies, current medications, past family history, past medical history, past social history, past surgical history and problem list. Problem list updated.   Objective  Blood pressure 128/78, weight 226 lb 12.8 oz (102.9 kg), last menstrual period 01/20/2018. Pregravid weight 215 lb (97.5 kg) Total Weight Gain 11 lb (4.99 kg) Urinalysis: Urine dipstick shows negative for glucose, protein. Fetal Status: Fetal Heart Rate (bpm): 115   Movement: Present     General:  Alert, oriented and cooperative. Patient is in no acute distress.  Skin: Skin is warm and dry. No rash noted.   Cardiovascular: Normal heart rate noted  Respiratory: Normal respiratory effort, no problems  with respiration noted  Abdomen: Soft, gravid, appropriate for gestational age. Pain/Pressure: Present     Pelvic:  Cervical exam deferred        Extremities: Normal range of motion.  Edema: None  Mental Status: Normal mood and affect. Normal behavior. Normal judgment and thought content.   NST Baseline: 115 Variability: minimal to moderate Accelerations: absent Decelerations: absent Tocometry: not done The patient was monitored for 15 minutes, fetal heart rate tracing was deemed non-reactive.  Assessment   33 y.o. [id number] at [redacted]w[redacted]d, EDD 11/07/2018 by Ultrasound presenting for a routine prenatal visit with a non-reactive NST  Plan   pregnancy Problems (from 03/29/18 to present)    Problem Noted Resolved   Tobacco use during pregnancy 07/15/2018 by Homero Fellers, MD No   HSV-2 infection complicating pregnancy, second trimester 06/09/2018 by Gae Dry, MD No   Trichomonas vaginalis infection 04/06/2018 by Rod Can, CNM No   Chronic hypertension affecting pregnancy 03/30/2018 by Rod Can, CNM No   Chronic post-traumatic stress disorder (PTSD) 03/30/2018 by Rod Can, CNM No   Genital herpes affecting pregnancy 03/30/2018 by Rod Can, CNM No   Supervision of high risk pregnancy, antepartum 03/30/2018 by Rod Can, CNM No   Overview Addendum 09/17/2018 10:31 AM by Gae Dry, MD    Clinic [facility] Prenatal Labs  Dating  8 wk [place] Blood type: O/Positive/-- (12/30 1157)   Genetic Screen   NIPS: normal XX Antibody:Negative (12/30 1157)  Anatomic [place] complete Rubella: 1.80 (12/30 1157) Varicella: Immune  GTT Early:   93  Third trimester: nml  RPR: Non Reactive (12/30 1157)   Rhogam  not needed HBsAg: Negative (12/30 1157)   TDaP vaccine       09/03/2018     Flu Shot:no HIV: Non Reactive (12/30 1157)   Baby Food  Breast  GBS:   Contraception   BTL, papers signed 5/29 Pap: NIL 2018  CBB  No   CS/VBAC NA   Support  Person Casimiro NeedleMichael           She will go to L& D triage at the hospital now for non-reactive NST and mild fetal bradycardia with HR 112-120s. Vibroacoustic stimulation was not successful.  Term labor symptoms and general obstetric precautions including but not limited to vaginal bleeding, contractions, leaking of fluid and fetal movement were reviewed.  Please refer to After Visit Summary for other counseling recommendations.   Return in about 3 days (around 10/21/2018) for ROB with NST/AFI.  Marcelyn BruinsJacelyn Dorothye Berni, CNM 10/18/2018  12:20 PM

## 2018-10-19 LAB — CERVICOVAGINAL ANCILLARY ONLY
Chlamydia: NEGATIVE
Neisseria Gonorrhea: NEGATIVE
Trichomonas: NEGATIVE

## 2018-10-21 ENCOUNTER — Other Ambulatory Visit: Payer: Self-pay

## 2018-10-21 ENCOUNTER — Encounter: Payer: Self-pay | Admitting: Obstetrics and Gynecology

## 2018-10-21 ENCOUNTER — Ambulatory Visit (INDEPENDENT_AMBULATORY_CARE_PROVIDER_SITE_OTHER): Payer: Medicaid Other | Admitting: Obstetrics and Gynecology

## 2018-10-21 ENCOUNTER — Ambulatory Visit (INDEPENDENT_AMBULATORY_CARE_PROVIDER_SITE_OTHER): Payer: Medicaid Other

## 2018-10-21 VITALS — BP 134/80 | Wt 226.0 lb

## 2018-10-21 DIAGNOSIS — Z3A37 37 weeks gestation of pregnancy: Secondary | ICD-10-CM

## 2018-10-21 DIAGNOSIS — O403XX0 Polyhydramnios, third trimester, not applicable or unspecified: Secondary | ICD-10-CM | POA: Diagnosis not present

## 2018-10-21 DIAGNOSIS — O409XX0 Polyhydramnios, unspecified trimester, not applicable or unspecified: Secondary | ICD-10-CM

## 2018-10-21 DIAGNOSIS — B009 Herpesviral infection, unspecified: Secondary | ICD-10-CM

## 2018-10-21 DIAGNOSIS — O10913 Unspecified pre-existing hypertension complicating pregnancy, third trimester: Secondary | ICD-10-CM

## 2018-10-21 DIAGNOSIS — O0990 Supervision of high risk pregnancy, unspecified, unspecified trimester: Secondary | ICD-10-CM

## 2018-10-21 DIAGNOSIS — O98513 Other viral diseases complicating pregnancy, third trimester: Secondary | ICD-10-CM

## 2018-10-21 DIAGNOSIS — O0993 Supervision of high risk pregnancy, unspecified, third trimester: Secondary | ICD-10-CM

## 2018-10-21 DIAGNOSIS — O10919 Unspecified pre-existing hypertension complicating pregnancy, unspecified trimester: Secondary | ICD-10-CM

## 2018-10-21 DIAGNOSIS — O98512 Other viral diseases complicating pregnancy, second trimester: Secondary | ICD-10-CM

## 2018-10-21 DIAGNOSIS — O99333 Smoking (tobacco) complicating pregnancy, third trimester: Secondary | ICD-10-CM

## 2018-10-21 NOTE — Progress Notes (Signed)
Routine Prenatal Care Visit  Subjective  Karen Wallace is a 33 y.o. [phone] at [redacted]w[redacted]d being seen today for ongoing prenatal care.  She is currently monitored for the following issues for this high-risk pregnancy and has Chronic hypertension affecting pregnancy; Chronic post-traumatic stress disorder (PTSD); Genital herpes affecting pregnancy; Supervision of high risk pregnancy, antepartum; Trichomonas vaginalis infection; RLQ abdominal pain; Tobacco abuse counseling; HSV-2 infection complicating pregnancy, second trimester; Depression; Anxiety; Tobacco use during pregnancy; Genitourinary infection, candidal; Polyhydramnios affecting pregnancy in third trimester; Indication for care in labor and delivery, antepartum; and NST (non-stress test) reactive on their problem list.  ----------------------------------------------------------------------------------- Patient reports no complaints.   Contractions: Irregular. Vag. Bleeding: None.  Movement: Present. Denies leaking of fluid.  U/S with AFI 32, though measurements are questionable.   Denies HA, visual changes, and RUQ pain ----------------------------------------------------------------------------------- The following portions of the patient's history were reviewed and updated as appropriate: allergies, current medications, past family history, past medical history, past social history, past surgical history and problem list. Problem list updated.   Objective  Blood pressure 134/80, weight 226 lb (102.5 kg), last menstrual period 01/20/2018. Pregravid weight 215 lb (97.5 kg) Total Weight Gain 11 lb (4.99 kg) Urinalysis: Urine Protein    Urine Glucose    Fetal Status: Fetal Heart Rate (bpm): 130   Movement: Present  Presentation: Vertex  General:  Alert, oriented and cooperative. Patient is in no acute distress.  Skin: Skin is warm and dry. No rash noted.   Cardiovascular: Normal heart rate noted  Respiratory: Normal respiratory effort,  no problems with respiration noted  Abdomen: Soft, gravid, appropriate for gestational age. Pain/Pressure: Present     Pelvic:  Cervical exam deferred        Extremities: Normal range of motion.  Edema: None  Mental Status: Normal mood and affect. Normal behavior. Normal judgment and thought content.   NST: Baseline FHR: 130 beats/min Variability: moderate Accelerations: present Decelerations: absent Tocometry: not done  Interpretation:  INDICATIONS: chronic hypertension RESULTS:  A NST procedure was performed with FHR monitoring and a normal baseline established, appropriate time of 20-40 minutes of evaluation, and accels >2 seen w 15x15 characteristics.  Results show a REACTIVE NST.    Imaging Results US Ob Limited  Result Date: 10/21/2018 Patient Name: Karen Wallace DOB: 01/02/1986 MRN: [id number] ULTRASOUND REPORT Location: [facility] Date of Service: 10/21/2018 Indications:AFI Findings: Karen Wallace intrauterine pregnancy is visualized with FHR at 124 BPM. Fetal presentation is Cephalic. Placenta: anterior. Grade: 1 AFI: 31.9 cm Impression: 1. [redacted]w[redacted]d Viable Singleton Intrauterine pregnancy dated by previously established criteria. 2. AFI is 31.9 cm. Recommendations: 1.Clinical correlation with the patient's History and Physical Exam. 2. Continue to monitor AFI Karen Wallace, RT The ultrasound images and findings were reviewed by me and I agree with the above report. Prentice Docker, MD, [facility], [facility] 10/21/2018 11:50 AM       Assessment   33 y.o. [id number] at [redacted]w[redacted]d by  11/07/2018, by Ultrasound presenting for routine prenatal visit  Plan   pregnancy Problems (from 03/29/18 to present)    Problem Noted Resolved   NST (non-stress test) reactive 10/18/2018 by Rod Can, CNM No   Tobacco use during pregnancy 07/15/2018 by Homero Fellers, MD No   HSV-2 infection complicating pregnancy, second trimester 06/09/2018 by Gae Dry, MD No    Trichomonas vaginalis infection 04/06/2018 by Rod Can, CNM No   Chronic hypertension affecting pregnancy 03/30/2018 by Rod Can, CNM No  Chronic post-traumatic stress disorder (PTSD) 03/30/2018 by Tresea MallGledhill, Jane, CNM No   Genital herpes affecting pregnancy 03/30/2018 by Tresea MallGledhill, Jane, CNM No   Supervision of high risk pregnancy, antepartum 03/30/2018 by Tresea MallGledhill, Jane, CNM No   Overview Addendum 09/17/2018 10:31 AM by Nadara MustardHarris, Robert P, MD    Clinic [facility] Prenatal Labs  Dating  8 wk US Blood type: O/Positive/-- (12/30 1157)   Genetic Screen   NIPS: normal XX Antibody:Negative (12/30 1157)  Anatomic US complete Rubella: 1.80 (12/30 1157) Varicella: Immune  GTT Early:   93  Third trimester: nml  RPR: Non Reactive (12/30 1157)   Rhogam  not needed HBsAg: Negative (12/30 1157)   TDaP vaccine       09/03/2018     Flu Shot:no HIV: Non Reactive (12/30 1157)   Baby Food  Breast                              GBS:   Contraception   BTL, papers signed 5/29 Pap: NIL 2018  CBB  No   CS/VBAC NA   Support Person Casimiro NeedleMichael              Term labor symptoms and general obstetric precautions including but not limited to vaginal bleeding, contractions, leaking of fluid and fetal movement were reviewed in detail with the patient. Please refer to After Visit Summary for other counseling recommendations.   Return in about 4 days (around 10/25/2018) for Routine Prenatal Appointment/NST, follow up 7 days for AFI w ROB/NST.  Thomasene MohairStephen Paitlyn Mcclatchey, MD, Merlinda FrederickFACOG [facility], [facility] 10/21/2018 11:59 AM

## 2018-10-25 ENCOUNTER — Ambulatory Visit (INDEPENDENT_AMBULATORY_CARE_PROVIDER_SITE_OTHER): Payer: Medicaid Other | Admitting: Obstetrics & Gynecology

## 2018-10-25 ENCOUNTER — Other Ambulatory Visit: Payer: Self-pay

## 2018-10-25 VITALS — BP 118/80 | Wt 227.0 lb

## 2018-10-25 DIAGNOSIS — O0993 Supervision of high risk pregnancy, unspecified, third trimester: Secondary | ICD-10-CM

## 2018-10-25 DIAGNOSIS — Z3A38 38 weeks gestation of pregnancy: Secondary | ICD-10-CM | POA: Diagnosis not present

## 2018-10-25 DIAGNOSIS — O10919 Unspecified pre-existing hypertension complicating pregnancy, unspecified trimester: Secondary | ICD-10-CM

## 2018-10-25 DIAGNOSIS — O0990 Supervision of high risk pregnancy, unspecified, unspecified trimester: Secondary | ICD-10-CM

## 2018-10-25 DIAGNOSIS — O403XX0 Polyhydramnios, third trimester, not applicable or unspecified: Secondary | ICD-10-CM

## 2018-10-25 DIAGNOSIS — O10913 Unspecified pre-existing hypertension complicating pregnancy, third trimester: Secondary | ICD-10-CM

## 2018-10-25 LAB — POCT URINALYSIS DIPSTICK OB

## 2018-10-25 NOTE — H&P (View-Only) (Signed)
History and Physical  Karen Wallace is a 33 y.o. [id number] [redacted]w[redacted]d  for Induction of Labor scheduled due to Gestational hypertension and polyhydramnios .   See labor record for pregnancy highlights.  No recent pain, bleeding, ruptured membranes, or other signs of progressing labor.  Occasional Karen Wallace.  HTN well controlled recently with Nifedipine.   PMHx: She  has a past medical history of Abnormal Pap smear of cervix, Anxiety, Depression, Genital herpes (09/2017), GERD (gastroesophageal reflux disease), Heart murmur, Hypertension, Indication for care in labor and delivery, antepartum (05/23/2015), Postpartum care following vaginal delivery (05/25/2015), and Preterm contractions (04/11/2015). Also,  has a past surgical history that includes Appendectomy (april 2016); Dilation and curettage of uterus; colposcopy of cervix; and Cholecystectomy (N/A, 11/19/2016)., family history includes Depression in her mother; Hypertension in her father and mother.,  reports that she has been smoking cigarettes. She has a 2.50 pack-year smoking history. She has never used smokeless tobacco. She reports previous alcohol use. She reports that she does not use drugs. She has a current medication list which includes the following prescription(s): buspirone, nifedipine, provida ob, and valacyclovir. Also, is allergic to lisinopril. OB History  Gravida Para Term Preterm AB Living  4 2 2  0 1 2  SAB TAB Ectopic Multiple Live Births  1 0 0 0 2    # Outcome Date GA Lbr Len/2nd Weight Sex Delivery Anes PTL Lv  4 Current           3 Term 05/24/15 [redacted]w[redacted]d 07:50 / 00:54 7 lb 7 oz (3.374 kg) F Vag-Spont EPI  LIV  2 Term 12/12/07   7 lb 6 oz (3.345 kg) F Vag-Spont  N LIV  1 SAB 04/21/05          Patient denies any other pertinent gynecologic issues.   ROS- all neg except as above  Objective: BP 118/80   Wt 227 lb (103 kg)   LMP 01/20/2018   BMI 34.52 kg/m  OBGyn Exam Physical examination Constitutional NAD, Conversant   Skin No rashes, lesions or ulceration. Normal palpated skin turgor. No nodularity.  Lungs: Clear to auscultation.No rales or wheezes. Normal Respiratory effort, no retractions.  Heart: NSR.  No murmurs or rubs appreciated. No periferal edema  Abdomen: Gravid.  Non-tender.  No masses.  No HSM. No hernia  Extremities: Moves all appropriately.  Normal ROM for age. No lymphadenopathy.  Neuro: Grossly intact  Psych: Oriented to PPT.  Normal mood. Normal affect.     Pelvic:   Vulva: Normal appearance.  No lesions.  Vagina: No lesions or abnormalities noted.  Urethra No masses tenderness or scarring.  Meatus Normal size without lesions or prolapse.  Cervix: FT/30/-3.   Perineum: Normal exam.  No lesions.        Bimanual   Uterus: Enlarged.  Non-tender.    Adnexae: Not palpated.  Cul-de-sac: Negative for abnormality.   A NST procedure was performed with FHR monitoring and a normal baseline established, appropriate time of 20-40 minutes of evaluation, and accels >2 seen w 15x15 characteristics.  Results show a REACTIVE NST.   Assessment: Term Pregnancy for Induction of Labor due to cHTN and Polyhydramnios.  Plan: Patient will undergo induction of labor with cervical ripening agents.     Patient has been fully informed of the pros and cons, risks and benefits of continued observation with fetal monitoring versus that of induction of labor.   She understands that there are uncommon risks to induction, which include but are  not limited to : frequent or prolonged uterine contractions, fetal distress, uterine rupture, and lack of successful induction.  These risks include all methods including Pitocin and Misoprostol and Cervadil.  Patient understands that using Misoprostol for labor induction is an "off label" indication although it has been studied extensively for this purpose and is an accepted method of induction.  She also has been informed of the increased risks for Cesarean with induction and  should induction not be successful.  Patient consents to the induction plan of management.  Plans bilateral tubal ligation for contraception  Annamarie MajorPaul Sabastian Raimondi, MD, Merlinda FrederickFACOG [facility], [facility] 10/25/2018  2:15 PM

## 2018-10-25 NOTE — Progress Notes (Signed)
History and Physical  ANTHONETTE LESAGE is a 33 y.o. [id number] [redacted]w[redacted]d  for Induction of Labor scheduled due to Gestational hypertension and polyhydramnios .   See labor record for pregnancy highlights.  No recent pain, bleeding, ruptured membranes, or other signs of progressing labor.  Occasional Montine Circle.  HTN well controlled recently with Nifedipine.   PMHx: She  has a past medical history of Abnormal Pap smear of cervix, Anxiety, Depression, Genital herpes (09/2017), GERD (gastroesophageal reflux disease), Heart murmur, Hypertension, Indication for care in labor and delivery, antepartum (05/23/2015), Postpartum care following vaginal delivery (05/25/2015), and Preterm contractions (04/11/2015). Also,  has a past surgical history that includes Appendectomy (april 2016); Dilation and curettage of uterus; colposcopy of cervix; and Cholecystectomy (N/A, 11/19/2016)., family history includes Depression in her mother; Hypertension in her father and mother.,  reports that she has been smoking cigarettes. She has a 2.50 pack-year smoking history. She has never used smokeless tobacco. She reports previous alcohol use. She reports that she does not use drugs. She has a current medication list which includes the following prescription(s): buspirone, nifedipine, provida ob, and valacyclovir. Also, is allergic to lisinopril. OB History  Gravida Para Term Preterm AB Living  4 2 2  0 1 2  SAB TAB Ectopic Multiple Live Births  1 0 0 0 2    # Outcome Date GA Lbr Len/2nd Weight Sex Delivery Anes PTL Lv  4 Current           3 Term 05/24/15 [redacted]w[redacted]d 07:50 / 00:54 7 lb 7 oz (3.374 kg) F Vag-Spont EPI  LIV  2 Term 12/12/07   7 lb 6 oz (3.345 kg) F Vag-Spont  N LIV  1 SAB 04/21/05          Patient denies any other pertinent gynecologic issues.   ROS- all neg except as above  Objective: BP 118/80   Wt 227 lb (103 kg)   LMP 01/20/2018   BMI 34.52 kg/m  OBGyn Exam Physical examination Constitutional NAD, Conversant   Skin No rashes, lesions or ulceration. Normal palpated skin turgor. No nodularity.  Lungs: Clear to auscultation.No rales or wheezes. Normal Respiratory effort, no retractions.  Heart: NSR.  No murmurs or rubs appreciated. No periferal edema  Abdomen: Gravid.  Non-tender.  No masses.  No HSM. No hernia  Extremities: Moves all appropriately.  Normal ROM for age. No lymphadenopathy.  Neuro: Grossly intact  Psych: Oriented to PPT.  Normal mood. Normal affect.     Pelvic:   Vulva: Normal appearance.  No lesions.  Vagina: No lesions or abnormalities noted.  Urethra No masses tenderness or scarring.  Meatus Normal size without lesions or prolapse.  Cervix: FT/30/-3.   Perineum: Normal exam.  No lesions.        Bimanual   Uterus: Enlarged.  Non-tender.    Adnexae: Not palpated.  Cul-de-sac: Negative for abnormality.   A NST procedure was performed with FHR monitoring and a normal baseline established, appropriate time of 20-40 minutes of evaluation, and accels >2 seen w 15x15 characteristics.  Results show a REACTIVE NST.   Assessment: Term Pregnancy for Induction of Labor due to cHTN and Polyhydramnios.  Plan: Patient will undergo induction of labor with cervical ripening agents.     Patient has been fully informed of the pros and cons, risks and benefits of continued observation with fetal monitoring versus that of induction of labor.   She understands that there are uncommon risks to induction, which include but are  not limited to : frequent or prolonged uterine contractions, fetal distress, uterine rupture, and lack of successful induction.  These risks include all methods including Pitocin and Misoprostol and Cervadil.  Patient understands that using Misoprostol for labor induction is an "off label" indication although it has been studied extensively for this purpose and is an accepted method of induction.  She also has been informed of the increased risks for Cesarean with induction and  should induction not be successful.  Patient consents to the induction plan of management.  Plans bilateral tubal ligation for contraception  Paul Shem Plemmons, MD, FACOG [facility], [facility] 10/25/2018  2:15 PM  

## 2018-10-25 NOTE — Progress Notes (Signed)
  [facility] INDUCTION ASSESSMENT SCHEDULING Karen Wallace 1985-09-06 Medical record #: [id number] Phone #:  Home Phone [phone]  Mobile [phone]    Prenatal Provider:[facility] Delivering Group[facility] Proposed admission date/time:10/31/18 at 0500 Method of induction:Cytotec  Weight: Filed Weights07/06/20 1341Weight:227 lb (103 kg) BMI Body mass index is 34.52 kg/m. HIV Negative HSV Negative EDC Estimated Date of Delivery: 7/19/20based on:US at [redacted] wks  Gestational age on admission: 76 Gravidity/parity:G4P2012  Cervix Score   0 1 2 3   Position Posterior Midposition Anterior   Consistency Firm Medium Soft   Effacement (%) 0-30 40-50 60-70 >80  Dilation (cm) Closed 1-2 3-4 >5  Baby's station -3 -2 -1 +1, +2   Bishop Score:4   Medical induction of labor  select indication(s) below Elective induction ?39 weeks multiparous patient ?39 weeks primiparous patient with Bishop score ?7 ?40 weeks primiparous patient   Medical Indications Adapted from [employer] #[id number], "Medically Indicated Late Preterm and Early Term Deliveries," 2013.  PLACENTAL / UTERINE ISSUES FETAL ISSUES MATERNAL ISSUES  ? Placenta previa (36.0-37.6) ? Isoimmunization (37.0-38.6) ? Preeclampsia without severe features or gestational HTN (37.0)  ? Suspected accreta (34.0-35.6) ? Growth Restriction Karen Wallace) ? Preeclampsia with severe features (34.0)  ? Prior classical CD, uterine window, rupture (36.0-37.6) ? Isolated (38.0-39.6) ? Chronic HTN (38.0-39.6)  ? Prior myomectomy (37.0-38.6) ? Concurrent findings (34.0-37.6) ? Cholestasis (37.0)  ? 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 Growth Restriction (Twins) ? Diabetes  ? Placental abruption (chronic) ? Di-Di Isolated (36.0-37.6) ? Pregestational, controlled (39.0)  OBSTETRIC ISSUES ? Di-Di concurrent findings (32.0-34.6) ? Pregestational, uncontrolled (37.0-39.0)  ? Postdates ? (41 weeks) ? Mo-Di isolated (32.0-34.6) ?  Pregestational, vascular compromise (37.0- 39.0)  ? PPROM (34.0) ? Multiple Gestation ? Gestational, diet controlled (40.0)  ? Hx of IUFD (39.0 weeks) ? Di-Di (38.0-38.6) ? Gestational, med controlled (39.0)  ? Polyhydramnios, mild/moderate; SDV 8-16 or AFI 25-35 (39.0) ? Mo-Di (36.0-37.6) ? Gestational, uncontrolled (38.0-39.0)  ? Oligohydramnios (36.0-37.6); MVP <2 cm    Provider Signature: Hoyt Koch Scheduled GO:TLXBW Date:10/25/2018 2:00 PM   Call [phone] to finalize the induction date/time  [id number] (07/17)

## 2018-10-25 NOTE — Patient Instructions (Signed)
Labor Induction Sunday 0500  PRE ADMISSION TESTING For Covid, prior to procedure Thursday 9:00-10:00 [facility] entrance (drive up)  Results in [id number] hours You will not receive notification if test results are negative. If positive for Covid19, your provider will notify you by phone, with additional instructions.   Labor induction is when steps are taken to cause a pregnant woman to begin the labor process. Most women go into labor on their own between 37 weeks and 42 weeks of pregnancy. When this does not happen or when there is a medical need for labor to begin, steps may be taken to induce labor. Labor induction causes a pregnant woman's uterus to contract. It also causes the cervix to soften (ripen), open (dilate), and thin out (efface). Usually, labor is not induced before 39 weeks of pregnancy unless there is a medical reason to do so. Your health care provider will determine if labor induction is needed. Before inducing labor, your health care provider will consider a number of factors, including:  Your medical condition and your baby's.  How many weeks along you are in your pregnancy.  How mature your baby's lungs are.  The condition of your cervix.  The position of your baby.  The size of your birth canal. What are some reasons for labor induction? Labor may be induced if:  Your health or your baby's health is at risk.  Your pregnancy is overdue by 1 week or more.  Your water breaks but labor does not start on its own.  There is a low amount of amniotic fluid around your baby. You may also choose (elect) to have labor induced at a certain time. Generally, elective labor induction is done no earlier than 39 weeks of pregnancy. What methods are used for labor induction? Methods used for labor induction include:  Prostaglandin medicine. This medicine starts contractions and causes the cervix to dilate and ripen. It can be taken by mouth (orally) or by being  inserted into the vagina (suppository).  Inserting a small, thin tube (catheter) with a balloon into the vagina and then expanding the balloon with water to dilate the cervix.  Stripping the membranes. In this method, your health care provider gently separates amniotic sac tissue from the cervix. This causes the cervix to stretch, which in turn causes the release of a hormone called progesterone. The hormone causes the uterus to contract. This procedure is often done during an office visit, after which you will be sent home to wait for contractions to begin.  Breaking the water. In this method, your health care provider uses a small instrument to make a small hole in the amniotic sac. This eventually causes the amniotic sac to break. Contractions should begin after a few hours.  Medicine to trigger or strengthen contractions. This medicine is given through an IV that is inserted into a vein in your arm. Except for membrane stripping, which can be done in a clinic, labor induction is done in the hospital so that you and your baby can be carefully monitored. How long does it take for labor to be induced? The length of time it takes to induce labor depends on how ready your body is for labor. Some inductions can take up to 2-3 days, while others may take less than a day. Induction may take longer if:  You are induced early in your pregnancy.  It is your first pregnancy.  Your cervix is not ready. What are some risks associated with labor induction?  Some risks associated with labor induction include:  Changes in fetal heart rate, such as being too high, too low, or irregular (erratic).  Failed induction.  Infection in the mother or the baby.  Increased risk of having a cesarean delivery.  Fetal death.  Breaking off (abruption) of the placenta from the uterus (rare).  Rupture of the uterus (very rare). When induction is needed for medical reasons, the benefits of induction generally  outweigh the risks. What are some reasons for not inducing labor? Labor induction should not be done if:  Your baby does not tolerate contractions.  You have had previous surgeries on your uterus, such as a myomectomy, removal of fibroids, or a vertical scar from a previous cesarean delivery.  Your placenta lies very low in your uterus and blocks the opening of the cervix (placenta previa).  Your baby is not in a head-down position.  The umbilical cord drops down into the birth canal in front of the baby.  There are unusual circumstances, such as the baby being very early (premature).  You have had more than 2 previous cesarean deliveries. Summary  Labor induction is when steps are taken to cause a pregnant woman to begin the labor process.  Labor induction causes a pregnant woman's uterus to contract. It also causes the cervix to ripen, dilate, and efface.  Labor is not induced before 39 weeks of pregnancy unless there is a medical reason to do so.  When induction is needed for medical reasons, the benefits of induction generally outweigh the risks. This information is not intended to replace advice given to you by your health care provider. Make sure you discuss any questions you have with your health care provider. Document Released: 08/27/2006 Document Revised: 04/10/2017 Document Reviewed: 05/21/2016 Elsevier Patient Education  2020 [employer].

## 2018-10-28 ENCOUNTER — Ambulatory Visit
Admission: RE | Admit: 2018-10-28 | Discharge: 2018-10-28 | Disposition: A | Discharge status: Home / Self Care | Payer: Medicaid Other | Source: Ambulatory Visit | Attending: Obstetrics & Gynecology | Admitting: Obstetrics & Gynecology

## 2018-10-28 ENCOUNTER — Other Ambulatory Visit: Payer: Self-pay

## 2018-10-28 ENCOUNTER — Ambulatory Visit (INDEPENDENT_AMBULATORY_CARE_PROVIDER_SITE_OTHER): Payer: Medicaid Other

## 2018-10-28 ENCOUNTER — Ambulatory Visit (INDEPENDENT_AMBULATORY_CARE_PROVIDER_SITE_OTHER): Payer: Medicaid Other | Admitting: Certified Nurse Midwife

## 2018-10-28 VITALS — BP 110/70 | Wt 224.0 lb

## 2018-10-28 DIAGNOSIS — O0990 Supervision of high risk pregnancy, unspecified, unspecified trimester: Secondary | ICD-10-CM

## 2018-10-28 DIAGNOSIS — Z1159 Encounter for screening for other viral diseases: Secondary | ICD-10-CM | POA: Insufficient documentation

## 2018-10-28 DIAGNOSIS — O403XX0 Polyhydramnios, third trimester, not applicable or unspecified: Secondary | ICD-10-CM | POA: Diagnosis not present

## 2018-10-28 DIAGNOSIS — O10913 Unspecified pre-existing hypertension complicating pregnancy, third trimester: Secondary | ICD-10-CM

## 2018-10-28 DIAGNOSIS — Z3A38 38 weeks gestation of pregnancy: Secondary | ICD-10-CM | POA: Diagnosis not present

## 2018-10-28 DIAGNOSIS — Z01812 Encounter for preprocedural laboratory examination: Secondary | ICD-10-CM | POA: Diagnosis present

## 2018-10-28 DIAGNOSIS — Z3A37 37 weeks gestation of pregnancy: Secondary | ICD-10-CM | POA: Diagnosis not present

## 2018-10-28 DIAGNOSIS — O10919 Unspecified pre-existing hypertension complicating pregnancy, unspecified trimester: Secondary | ICD-10-CM

## 2018-10-28 LAB — POCT URINALYSIS DIPSTICK OB
Glucose, UA: NEGATIVE
POC,PROTEIN,UA: NEGATIVE

## 2018-10-29 ENCOUNTER — Ambulatory Visit
Admission: RE | Admit: 2018-10-29 | Discharge: 2018-10-29 | Disposition: A | Discharge status: Home / Self Care | Payer: Medicaid Other | Source: Ambulatory Visit | Attending: Obstetrics & Gynecology | Admitting: Obstetrics & Gynecology

## 2018-10-29 ENCOUNTER — Inpatient Hospital Stay
Admission: RE | Admit: 2018-10-29 | Discharge: 2018-10-29 | Disposition: A | Discharge status: Home / Self Care | Payer: Medicaid Other | Source: Ambulatory Visit | Attending: Obstetrics & Gynecology | Admitting: Obstetrics & Gynecology

## 2018-10-29 LAB — SARS CORONAVIRUS 2 (TAT 6-24 HRS): SARS Coronavirus 2: NEGATIVE

## 2018-10-31 ENCOUNTER — Inpatient Hospital Stay
Admission: EM | Admit: 2018-10-31 | Discharge: 2018-11-02 | DRG: 798 | Disposition: A | Discharge status: Home / Self Care | Payer: Medicaid Other | Attending: Obstetrics & Gynecology | Admitting: Obstetrics & Gynecology

## 2018-10-31 ENCOUNTER — Other Ambulatory Visit: Payer: Self-pay

## 2018-10-31 DIAGNOSIS — O99334 Smoking (tobacco) complicating childbirth: Secondary | ICD-10-CM | POA: Diagnosis present

## 2018-10-31 DIAGNOSIS — O163 Unspecified maternal hypertension, third trimester: Secondary | ICD-10-CM | POA: Diagnosis present

## 2018-10-31 DIAGNOSIS — Z3A39 39 weeks gestation of pregnancy: Secondary | ICD-10-CM

## 2018-10-31 DIAGNOSIS — O1002 Pre-existing essential hypertension complicating childbirth: Principal | ICD-10-CM | POA: Diagnosis present

## 2018-10-31 DIAGNOSIS — F17210 Nicotine dependence, cigarettes, uncomplicated: Secondary | ICD-10-CM | POA: Diagnosis present

## 2018-10-31 DIAGNOSIS — Z302 Encounter for sterilization: Secondary | ICD-10-CM | POA: Diagnosis not present

## 2018-10-31 DIAGNOSIS — O403XX0 Polyhydramnios, third trimester, not applicable or unspecified: Secondary | ICD-10-CM | POA: Diagnosis present

## 2018-10-31 DIAGNOSIS — Z888 Allergy status to other drugs, medicaments and biological substances status: Secondary | ICD-10-CM | POA: Diagnosis not present

## 2018-10-31 DIAGNOSIS — O1092 Unspecified pre-existing hypertension complicating childbirth: Secondary | ICD-10-CM

## 2018-10-31 LAB — COMPREHENSIVE METABOLIC PANEL
ALT: 18 U/L (ref 0–44)
AST: 22 U/L (ref 15–41)
Albumin: 3 g/dL — ABNORMAL LOW (ref 3.5–5.0)
Alkaline Phosphatase: 104 U/L (ref 38–126)
Anion gap: 10 (ref 5–15)
BUN: 10 mg/dL (ref 6–20)
CO2: 19 mmol/L — ABNORMAL LOW (ref 22–32)
Calcium: 8.8 mg/dL — ABNORMAL LOW (ref 8.9–10.3)
Chloride: 107 mmol/L (ref 98–111)
Creatinine, Ser: 0.45 mg/dL (ref 0.44–1.00)
GFR calc Af Amer: >60 mL/min (ref >60)
GFR calc non Af Amer: >60 mL/min (ref >60)
Glucose, Bld: 84 mg/dL (ref 70–99)
Potassium: 3.6 mmol/L (ref 3.5–5.1)
Sodium: 136 mmol/L (ref 135–145)
Total Bilirubin: 0.5 mg/dL (ref 0.3–1.2)
Total Protein: 6.5 g/dL (ref 6.5–8.1)

## 2018-10-31 LAB — PROTEIN / CREATININE RATIO, URINE
Creatinine, Urine: 162 mg/dL
Protein Creatinine Ratio: 0.22 mg/mg{Cre} — ABNORMAL HIGH (ref 0.00–0.15)
Total Protein, Urine: 36 mg/dL

## 2018-10-31 LAB — CBC
HCT: 30.3 % — ABNORMAL LOW (ref 36.0–46.0)
Hemoglobin: 10.6 g/dL — ABNORMAL LOW (ref 12.0–15.0)
MCH: 34.5 pg — ABNORMAL HIGH (ref 26.0–34.0)
MCHC: 35 g/dL (ref 30.0–36.0)
MCV: 98.7 fL (ref 80.0–100.0)
Platelets: 288 10*3/uL (ref 150–400)
RBC: 3.07 MIL/uL — ABNORMAL LOW (ref 3.87–5.11)
RDW: 13.7 % (ref 11.5–15.5)
WBC: 11.8 10*3/uL — ABNORMAL HIGH (ref 4.0–10.5)
nRBC: 0 % (ref 0.0–0.2)

## 2018-10-31 LAB — TYPE AND SCREEN
ABO/RH(D): O POS
Antibody Screen: NEGATIVE

## 2018-10-31 MED ORDER — ACETAMINOPHEN 325 MG PO TABS: 650.0000 mg | ORAL_TABLET | ORAL | Status: DC | PRN

## 2018-10-31 MED ORDER — MISOPROSTOL 25 MCG QUARTER TABLET
25.0000 ug | ORAL_TABLET | ORAL | Status: DC | PRN
  Administered 2018-10-31 (×4): 25 ug via VAGINAL
  Filled 2018-10-31 (×4): qty 1

## 2018-10-31 MED ORDER — LIDOCAINE HCL (PF) 1 % IJ SOLN
30.0000 mL | INTRAMUSCULAR | Status: DC | PRN
  Filled 2018-10-31: qty 30

## 2018-10-31 MED ORDER — MISOPROSTOL 200 MCG PO TABS
ORAL_TABLET | ORAL | Status: AC
  Filled 2018-10-31: qty 4

## 2018-10-31 MED ORDER — OXYTOCIN 40 UNITS IN NORMAL SALINE INFUSION - SIMPLE MED
2.5000 [IU]/h | INTRAVENOUS | Status: DC
  Filled 2018-10-31: qty 1000

## 2018-10-31 MED ORDER — AMMONIA AROMATIC IN INHA
RESPIRATORY_TRACT | Status: AC
  Filled 2018-10-31: qty 10

## 2018-10-31 MED ORDER — LACTATED RINGERS IV SOLN
INTRAVENOUS | Status: DC
  Administered 2018-10-31 (×3): via INTRAVENOUS

## 2018-10-31 MED ORDER — BUTORPHANOL TARTRATE 2 MG/ML IJ SOLN: 1.0000 mg | INTRAMUSCULAR | Status: DC | PRN

## 2018-10-31 MED ORDER — ONDANSETRON HCL 4 MG/2ML IJ SOLN
4.0000 mg | Freq: Four times a day (QID) | INTRAMUSCULAR | Status: DC | PRN
  Administered 2018-11-01: 4 mg via INTRAVENOUS
  Filled 2018-10-31: qty 2

## 2018-10-31 MED ORDER — LACTATED RINGERS IV SOLN
500.0000 mL | INTRAVENOUS | Status: DC | PRN
  Administered 2018-11-01: 500 mL via INTRAVENOUS

## 2018-10-31 MED ORDER — OXYTOCIN 10 UNIT/ML IJ SOLN
INTRAMUSCULAR | Status: AC
  Filled 2018-10-31: qty 2

## 2018-10-31 MED ORDER — NIFEDIPINE ER OSMOTIC RELEASE 30 MG PO TB24
30.0000 mg | ORAL_TABLET | Freq: Every day | ORAL | Status: DC
  Administered 2018-10-31 – 2018-11-01 (×2): 30 mg via ORAL
  Filled 2018-10-31 (×2): qty 1

## 2018-10-31 MED ORDER — OXYTOCIN BOLUS FROM INFUSION
500.0000 mL | Freq: Once | INTRAVENOUS | Status: AC
  Administered 2018-11-01: 500 mL via INTRAVENOUS

## 2018-10-31 MED ORDER — TERBUTALINE SULFATE 1 MG/ML IJ SOLN: 0.2500 mg | Freq: Once | INTRAMUSCULAR | Status: DC | PRN

## 2018-10-31 NOTE — H&P (Signed)
History and Physical Interval Note:  10/31/2018  Karen Wallace  has presented today for INDUCTION OF LABOR  with the diagnosis of Chronic hypertension. The various methods of treatment have been discussed with the patient and family. After consideration of risks, benefits and other options for treatment, the patient has consented to this Labor induction.  The patient's history has been reviewed, patient examined, no change in status, and is stable for induction as planned.  See H&P dated 10/25/2018. I have reviewed the patient's chart and labs.  Questions were answered to the patient's satisfaction.    External and speculum exam negative for HSV lesions 0.5/30/-3 Cytotec placed.  Avel Sensor, CNM

## 2018-10-31 NOTE — Progress Notes (Addendum)
Subjective:  Resting in bed, feeling some pain with contractions.  Objective:   Vitals: Blood pressure 123/70, pulse 92, temperature 98.4 F (36.9 C), temperature source Oral, resp. rate 18, height 5\' 8"  (1.727 m), weight 102.5 kg, last menstrual period 01/20/2018.  General: NAD Abdomen: gravid, non-tender Cervical Exam:  Dilation: 1 Effacement (%): 50 Station: -3 Presentation: Vertex Exam by:: Karen Wallace CNM  FHT: Baseline 125 bpm, variability: moderate; accelerations: present; declerations: absent Toco: Occasional contractions (ranging from 3-7.5 minutes), 40-150 sec in duration, mild  Results for orders placed or performed during the hospital encounter of 10/31/18 (from the past 24 hour(s))  CBC     Status: Abnormal   Collection Time: 10/31/18  6:37 AM  Result Value Ref Range   WBC 11.8 (H) 4.0 - 10.5 K/uL   RBC 3.07 (L) 3.87 - 5.11 MIL/uL   Hemoglobin 10.6 (L) 12.0 - 15.0 g/dL   HCT 30.3 (L) 36.0 - 46.0 %   MCV 98.7 80.0 - 100.0 fL   MCH 34.5 (H) 26.0 - 34.0 pg   MCHC 35.0 30.0 - 36.0 g/dL   RDW 13.7 11.5 - 15.5 %   Platelets 288 150 - 400 K/uL   nRBC 0.0 0.0 - 0.2 %  Comprehensive metabolic panel     Status: Abnormal   Collection Time: 10/31/18  6:37 AM  Result Value Ref Range   Sodium 136 135 - 145 mmol/L   Potassium 3.6 3.5 - 5.1 mmol/L   Chloride 107 98 - 111 mmol/L   CO2 19 (L) 22 - 32 mmol/L   Glucose, Bld 84 70 - 99 mg/dL   BUN 10 6 - 20 mg/dL   Creatinine, Ser 0.45 0.44 - 1.00 mg/dL   Calcium 8.8 (L) 8.9 - 10.3 mg/dL   Total Protein 6.5 6.5 - 8.1 g/dL   Albumin 3.0 (L) 3.5 - 5.0 g/dL   AST 22 15 - 41 U/L   ALT 18 0 - 44 U/L   Alkaline Phosphatase 104 38 - 126 U/L   Total Bilirubin 0.5 0.3 - 1.2 mg/dL   GFR calc non Af Amer >60 >60 mL/min   GFR calc Af Amer >60 >60 mL/min   Anion gap 10 5 - 15  Type and screen     Status: None   Collection Time: 10/31/18  6:37 AM  Result Value Ref Range   ABO/RH(D) O POS    Antibody Screen NEG    Sample Expiration       11/03/2018,2359 Performed at [facility], [place], [place] [address]   Protein / creatinine ratio, urine     Status: Abnormal   Collection Time: 10/31/18  7:43 AM  Result Value Ref Range   Creatinine, Urine 162 mg/dL   Total Protein, Urine 36 mg/dL   Protein Creatinine Ratio 0.22 (H) 0.00 - 0.15 mg/mg[Cre]    Assessment:   33 y.o. [id number] [redacted]w[redacted]d induction of labor for chronic hypertension  Plan:   1) Labor - Placed next dose of Cytotec.   2) Fetus - FWB reassuring  Avel Sensor, CNM 10/31/2018  3:45 PM

## 2018-10-31 NOTE — Progress Notes (Signed)
HROB/ NST/AFI  at 38wk4d : CHTN on Nifedipine 30 mgm XL/ Polyhydramnios/ Hx of HSV and depression/anxiety. Taking Buspar prn. Taking Valtrex BID Baby active. Irregular contractions/ uncomfortable NST reactive with baseline 135-140 and accelerations to 160s to 170s, moderate variability, no decelerations. AFI 84.6NG/ cephalic presentation BP 295/28, weight down 3#, negative proteinuria Last EFW 6/25: 7# (64.9%) IOL scheduled for 10/31/2018. Had Covid testing today Breast/bottle feeding BTL Dalia Heading, CNM

## 2018-11-01 ENCOUNTER — Inpatient Hospital Stay: Payer: Medicaid Other | Admitting: Anesthesiology

## 2018-11-01 DIAGNOSIS — O403XX0 Polyhydramnios, third trimester, not applicable or unspecified: Secondary | ICD-10-CM

## 2018-11-01 MED ORDER — DIPHENHYDRAMINE HCL 50 MG/ML IJ SOLN: 12.5000 mg | INTRAMUSCULAR | Status: DC | PRN

## 2018-11-01 MED ORDER — ACETAMINOPHEN 325 MG PO TABS
650.0000 mg | ORAL_TABLET | ORAL | Status: DC | PRN
  Administered 2018-11-02 (×2): 650 mg via ORAL
  Filled 2018-11-01 (×2): qty 2

## 2018-11-01 MED ORDER — SIMETHICONE 80 MG PO CHEW
80.0000 mg | CHEWABLE_TABLET | ORAL | Status: DC | PRN
  Administered 2018-11-02 (×2): 80 mg via ORAL
  Filled 2018-11-01 (×2): qty 1

## 2018-11-01 MED ORDER — FENTANYL 2.5 MCG/ML W/ROPIVACAINE 0.15% IN NS 100 ML EPIDURAL (ARMC)
EPIDURAL | Status: DC | PRN
  Administered 2018-11-01: 12 mL/h via EPIDURAL

## 2018-11-01 MED ORDER — EPHEDRINE 5 MG/ML INJ: 10.0000 mg | INTRAVENOUS | Status: DC | PRN

## 2018-11-01 MED ORDER — ONDANSETRON HCL 4 MG PO TABS: 4.0000 mg | ORAL_TABLET | ORAL | Status: DC | PRN

## 2018-11-01 MED ORDER — WITCH HAZEL-GLYCERIN EX PADS: 1.0000 "application " | MEDICATED_PAD | CUTANEOUS | Status: DC | PRN

## 2018-11-01 MED ORDER — SENNOSIDES-DOCUSATE SODIUM 8.6-50 MG PO TABS
2.0000 | ORAL_TABLET | ORAL | Status: DC
  Administered 2018-11-01: 2 via ORAL
  Filled 2018-11-01: qty 2

## 2018-11-01 MED ORDER — IBUPROFEN 600 MG PO TABS
600.0000 mg | ORAL_TABLET | Freq: Four times a day (QID) | ORAL | Status: DC
  Administered 2018-11-01 – 2018-11-02 (×5): 600 mg via ORAL
  Filled 2018-11-01 (×5): qty 1

## 2018-11-01 MED ORDER — LACTATED RINGERS IV SOLN: 500.0000 mL | Freq: Once | INTRAVENOUS | Status: DC

## 2018-11-01 MED ORDER — ONDANSETRON HCL 4 MG/2ML IJ SOLN: 4.0000 mg | INTRAMUSCULAR | Status: DC | PRN

## 2018-11-01 MED ORDER — COCONUT OIL OIL
1.0000 "application " | TOPICAL_OIL | Status: DC | PRN
  Administered 2018-11-01: 1 via TOPICAL
  Filled 2018-11-01: qty 120

## 2018-11-01 MED ORDER — PRENATAL MULTIVITAMIN CH
1.0000 | ORAL_TABLET | Freq: Every day | ORAL | Status: DC
  Administered 2018-11-01: 1 via ORAL
  Filled 2018-11-01: qty 1

## 2018-11-01 MED ORDER — TETANUS-DIPHTH-ACELL PERTUSSIS 5-2.5-18.5 LF-MCG/0.5 IM SUSP
0.5000 mL | Freq: Once | INTRAMUSCULAR | Status: DC
  Filled 2018-11-01: qty 0.5

## 2018-11-01 MED ORDER — PHENYLEPHRINE 40 MCG/ML (10ML) SYRINGE FOR IV PUSH (FOR BLOOD PRESSURE SUPPORT): 80.0000 ug | PREFILLED_SYRINGE | INTRAVENOUS | Status: DC | PRN

## 2018-11-01 MED ORDER — NIFEDIPINE ER OSMOTIC RELEASE 30 MG PO TB24
30.0000 mg | ORAL_TABLET | Freq: Every day | ORAL | Status: DC
  Administered 2018-11-01 – 2018-11-02 (×2): 30 mg via ORAL
  Filled 2018-11-01: qty 1

## 2018-11-01 MED ORDER — DIPHENHYDRAMINE HCL 25 MG PO CAPS: 25.0000 mg | ORAL_CAPSULE | Freq: Four times a day (QID) | ORAL | Status: DC | PRN

## 2018-11-01 MED ORDER — OXYTOCIN 40 UNITS IN NORMAL SALINE INFUSION - SIMPLE MED
1.0000 m[IU]/min | INTRAVENOUS | Status: DC
  Administered 2018-11-01: 1 m[IU]/min via INTRAVENOUS

## 2018-11-01 MED ORDER — SODIUM CHLORIDE 0.9 % IV SOLN
INTRAVENOUS | Status: DC | PRN
  Administered 2018-11-01 (×3): 5 mL via EPIDURAL

## 2018-11-01 MED ORDER — FENTANYL 2.5 MCG/ML W/ROPIVACAINE 0.15% IN NS 100 ML EPIDURAL (ARMC)
12.0000 mL/h | EPIDURAL | Status: DC
  Administered 2018-11-01: 12 mL/h via EPIDURAL
  Filled 2018-11-01 (×2): qty 100

## 2018-11-01 MED ORDER — LIDOCAINE HCL (PF) 1 % IJ SOLN
INTRAMUSCULAR | Status: DC | PRN
  Administered 2018-11-01: 1 mL

## 2018-11-01 MED ORDER — DIBUCAINE (PERIANAL) 1 % EX OINT: 1.0000 "application " | TOPICAL_OINTMENT | CUTANEOUS | Status: DC | PRN

## 2018-11-01 MED ORDER — BENZOCAINE-MENTHOL 20-0.5 % EX AERO: 1.0000 "application " | INHALATION_SPRAY | CUTANEOUS | Status: DC | PRN

## 2018-11-01 NOTE — Anesthesia Preprocedure Evaluation (Signed)
Anesthesia Evaluation  Patient identified by MRN, date of birth, ID band Patient awake    Reviewed: Allergy & Precautions, H&P , NPO status , Patient's Chart, lab work & pertinent test results  Airway Mallampati: III  TM Distance: >3 FB Neck ROM: full    Dental  (+) Teeth Intact   Pulmonary neg pulmonary ROS, Current Smoker,           Cardiovascular Exercise Tolerance: Good hypertension (Chronic HTN being treated with nifedipine), On Medications      Neuro/Psych PSYCHIATRIC DISORDERS Anxiety Depression    GI/Hepatic GERD  ,  Endo/Other    Renal/GU   negative genitourinary   Musculoskeletal   Abdominal   Peds  Hematology negative hematology ROS (+)   Anesthesia Other Findings Past Medical History: No date: Abnormal Pap smear of cervix     Comment:  2006/2010 No date: Anxiety No date: Depression 09/2017: 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 No date: GERD (gastroesophageal reflux disease)     Comment:  OTC GUMMIES PRN No date: Heart murmur     Comment:  AS A  CHILD No date: Hypertension 05/23/2015: Indication for care in labor and delivery, antepartum 05/25/2015: Postpartum care following vaginal delivery 04/11/2015: Preterm contractions  Past Surgical History: april 2016: APPENDECTOMY 11/19/2016: CHOLECYSTECTOMY; N/A     Comment:  Procedure: LAPAROSCOPIC CHOLECYSTECTOMY;  Surgeon:               Olean Ree, MD;  Location: [facility] ORS;  Service:               General;  Laterality: N/A; No date: colposcopy of cervix     Comment:  2007/2010 No date: DILATION AND CURETTAGE OF UTERUS  BMI    Body Mass Index: 34.36 kg/m      Reproductive/Obstetrics (+) Pregnancy                             Anesthesia Physical Anesthesia Plan  ASA: III  Anesthesia Plan: Epidural   Post-op Pain Management:    Induction:   PONV Risk Score and Plan:   Airway Management  Planned:   Additional Equipment:   Intra-op Plan:   Post-operative Plan:   Informed Consent: I have reviewed the patients History and Physical, chart, labs and discussed the procedure including the risks, benefits and alternatives for the proposed anesthesia with the patient or authorized representative who has indicated his/her understanding and acceptance.       Plan Discussed with: Anesthesiologist  Anesthesia Plan Comments:         Anesthesia Quick Evaluation

## 2018-11-01 NOTE — Anesthesia Procedure Notes (Signed)
Epidural Patient location during procedure: OB Start time: 11/01/2018 12:08 AM End time: 11/01/2018 12:27 AM  Staffing Anesthesiologist: Durenda Hurt, MD Performed: anesthesiologist   Preanesthetic Checklist Completed: patient identified, site marked, surgical consent, pre-op evaluation, timeout performed, IV checked, risks and benefits discussed and monitors and equipment checked  Epidural Patient position: sitting Prep: ChloraPrep Patient monitoring: heart rate, continuous pulse ox and blood pressure Approach: midline Location: L4-L5 Injection technique: LOR saline  Needle:  Needle type: Tuohy  Needle gauge: 18 G Needle length: 9 cm and 9 Needle insertion depth: 6 cm Catheter type: closed end flexible Catheter size: 20 Guage Catheter at skin depth: 10 cm Test dose: negative and Other  Assessment Events: blood not aspirated, injection not painful, no injection resistance, negative IV test and no paresthesia  Additional Notes Single attempt.  Negative paresthesia.  Negative aspiration.  Patient tolerated the insertion well without complications.Reason for block:procedure for pain

## 2018-11-01 NOTE — Discharge Summary (Signed)
OB Discharge Summary     Patient Name: Karen Wallace DOB: 08/29/1985 MRN: [id number]  Date of admission: 10/31/2018 Delivering provider: Rod Can, CNM  Date of Delivery: 11/01/2018  Date of discharge: 11/02/2018  Admitting diagnosis: 65 wks preg induction Intrauterine pregnancy: [redacted]w[redacted]d     Secondary diagnosis: Chronic Hypertension     Discharge diagnosis: Term Pregnancy Delivered and CHTN                                                                                                Post partum procedures:postpartum tubal ligation  Augmentation: Pitocin and Cytotec  Complications: None  Hospital course:  Induction of Labor With Vaginal Delivery   33 y.o. yo [id number] at [redacted]w[redacted]d was admitted to the hospital 10/31/2018 for induction of labor.  Indication for induction: Gestational hypertension.   Patient had an uncomplicated labor course as follows: Membrane Rupture Time/Date: 7:35 AM ,11/01/2018   Patient had delivery of viable female 10:24 AM, 11/01/2018  Details of delivery can be found in separate delivery note.    Patient had a routine postpartum course.  Patient is discharged home 11/02/18.  Physical exam  Vitals:   11/02/18 1120 11/02/18 1253 11/02/18 1524 11/02/18 1639  BP: 117/90 122/68 140/86 128/74  Pulse: 69 70 80 80  Resp: 20 18 18    Temp: (!) 97.5 F (36.4 C) 98 F (36.7 C) 98.6 F (37 C)   TempSrc: Oral Oral Oral   SpO2: 98% 97% 98%   Weight:      Height:       Labs: Lab Results  Component Value Date   WBC 13.1 (H) 11/02/2018   HGB 10.1 (L) 11/02/2018   HCT 29.3 (L) 11/02/2018   MCV 98.7 11/02/2018   PLT 255 11/02/2018    Discharge instruction: per After Visit Summary.  Medications:  Allergies as of 11/02/2018      Reactions   Lisinopril Cough      Medication List    STOP taking these medications   valACYclovir 500 MG tablet Commonly known as: VALTREX     TAKE these medications   busPIRone 10 MG tablet Commonly known as: BUSPAR TK  1/2 TO 1 T PO BID FOR MOOD   ibuprofen 600 MG tablet Commonly known as: ADVIL Take 1 tablet (600 mg total) by mouth every 6 (six) hours.   NIFEdipine 30 MG 24 hr tablet Commonly known as: PROCARDIA-XL/NIFEDICAL-XL Take 1 tablet (30 mg total) by mouth daily. Can increase to twice a day as needed for symptomatic contractions   oxyCODONE-acetaminophen 5-325 MG tablet Commonly known as: PERCOCET/ROXICET Take 1 tablet by mouth every 4 (four) hours as needed for moderate pain or severe pain.   Provida OB 20-20-1.25 MG Caps Take 1 tablet by mouth daily.       Diet: routine diet  Activity: Advance as tolerated. Pelvic rest for 6 weeks.   Outpatient follow up: Follow-up Information    Rod Can, CNM. Go in 1 week(s).   Specialty: Obstetrics Why: blood pressure check appointment- then schedule 6 week postpartum appointment Contact information: [address] [place] [address]  [phone]             Postpartum contraception: Tubal Ligation Rhogam Given postpartum: NA Rubella vaccine given postpartum: Rubella Immune Varicella vaccine given postpartum: Varicella Immune TDaP given antepartum or postpartum: given antepartum  Newborn Data: Live born female [name] Birth Weight:  3600g, 7 pounds 15 ounces APGAR: 8, 8  Newborn Delivery   Birth date/time: 11/01/2018 10:24:00 Delivery type: Vaginal, Spontaneous       Baby Feeding: breastfeeding  Disposition:home with mother  SIGNED:  Vena AustriaAndreas Shandrell Boda, MD 11/02/2018 6:30 PM

## 2018-11-01 NOTE — Progress Notes (Signed)
Subjective:  Resting in bed, comfortable with epidural.  Objective:   Vitals: Blood pressure 123/81, pulse 90, temperature 98.2 F (36.8 C), temperature source Oral, resp. rate 15, height 5\' 8"  (1.727 m), weight 102.5 kg, last menstrual period 01/20/2018, SpO2 94 %. General:  Abdomen: Cervical Exam:  Dilation: 3.5 Effacement (%): 60 Cervical Position: Posterior Station: Ballotable Presentation: Vertex Exam by:: Margarita Rana, RN  FHT: Baseline 120 bpm, variability: moderate; accelerations: present; decelerations; none. Toco: Contractions every 1.5-3 minutes, duration 80-100 seconds, moderate intensity  Results for orders placed or performed during the hospital encounter of 10/31/18 (from the past 24 hour(s))  Protein / creatinine ratio, urine     Status: Abnormal   Collection Time: 10/31/18  7:43 AM  Result Value Ref Range   Creatinine, Urine 162 mg/dL   Total Protein, Urine 36 mg/dL   Protein Creatinine Ratio 0.22 (H) 0.00 - 0.15 mg/mg[Cre]    Assessment:   33 y.o. [id number] [redacted]w[redacted]d induction of labor for chronic hypertension with polyhydramnios  Plan:   1) Labor - Continue induction with Pitocin. Consider AROM later (currently unfavorable with ballotable fetus and polyhydramnios)  2) Fetus - FWB reassuring.  Avel Sensor, CNM 11/01/2018  7:28 AM

## 2018-11-01 NOTE — Lactation Note (Signed)
This note was copied from a baby's chart. Lactation Consultation Note  Patient Name: Girl Christee Mervine CBSWH'Q Date: 11/01/2018 Reason for consult: Follow-up assessment   Maternal Data Formula Feeding for Exclusion: No Has patient been taught Hand Expression?: Yes Does the patient have breastfeeding experience prior to this delivery?: Yes  Feeding Feeding Type: Breast Fed  LATCH Score Latch: Grasps breast easily, tongue down, lips flanged, rhythmical sucking.  Audible Swallowing: Spontaneous and intermittent  Type of Nipple: Everted at rest and after stimulation  Comfort (Breast/Nipple): Soft / non-tender  Hold (Positioning): Assistance needed to correctly position infant at breast and maintain latch.  LATCH Score: 9  Interventions Interventions: Breast feeding basics reviewed;Assisted with latch;Skin to skin;Breast massage;Hand express;Reverse pressure;Breast compression;Adjust position;Support pillows;Position options;Coconut oil  Lactation Tools Discussed/Used Tools: Coconut oil WIC Program: Yes   Consult Status Consult Status: Follow-up Date: 11/01/18 Follow-up type: In-patient  LC spoke with mom about her feeding choices. She desires to offer breast and formula. Infant was showing hunger cues while LC was in the room. LC assisted mom with putting baby to the breast. Discussed comfort of moms position, and bringing baby to the breast, instead of breast to baby, and nose to nipple for latching. Infant showed flanged lips, wide open mouth, and had audible swallows.   Mom voiced that she may just need to continue practicing with breastfeeding, but would like to have formula available if she changes her mind. [place] let assigned nurse know moms feeding plan.  Reviewed early hunger cues, wet/stool diaper expectations for age, and onset of growth spurts. Encouraged frequent skin to skin, and breastfeeding. Encouraged to call out to [facility] for support if needed.   Lavonia Drafts 11/01/2018, 3:52 PM

## 2018-11-01 NOTE — Lactation Note (Signed)
This note was copied from a baby's chart. Lactation Consultation Note  Patient Name: Karen Wallace Date: 11/01/2018 Reason for consult: Initial assessment;Mother's request;Term  Assisted mom in comfortable position with pillow support in cross cradle skin to skin on right breast.  Mom tried to hand express on her own and could not get any colostrum.  Demonstrated better technique and got got a few more drops.  [name] latched with minimal assistance and began strong rhythmic sucking with occasional swallows.  Towards the end of the breast feed, [name] started slipping off to the tip of the nipple and mom started experiencing some nipple discomfort.  Demonstrated how to gently touch chin to lower lip and pull in closer for deeper latch.  Coconut oil given with instructions in use. This is mom's 3rd Karen.  She only breast fed her 33 yr old for 2 to 3 weeks reporting she breast and formula fed and never seemed to keep her satisfied.  Her 33 yr old she breast fed for 2 to 3 months and reports it went much better.  Reviewed supply and demand, normal course of lactation, newborn stomach size and routine feeding patterns.  Explained feeding cues and encouraged mom to put her to the breast whenever she demonstrated hunger cues.  When went out to mother/baby unit, lactation name and number written on white board and encouraged to call with any questions, concerns or assistance. Maternal Data Formula Feeding for Exclusion: No Has patient been taught Hand Expression?: Yes Does the patient have breastfeeding experience prior to this delivery?: Yes  Feeding Feeding Type: Breast Fed  LATCH Score Latch: Grasps breast easily, tongue down, lips flanged, rhythmical sucking.  Audible Swallowing: A few with stimulation  Type of Nipple: Everted at rest and after stimulation  Comfort (Breast/Nipple): Filling, red/small blisters or bruises, mild/mod discomfort  Hold (Positioning): Assistance needed to  correctly position infant at breast and maintain latch.  LATCH Score: 7  Interventions Interventions: Breast feeding basics reviewed;Assisted with latch;Skin to skin;Breast massage;Hand express;Reverse pressure;Breast compression;Adjust position;Support pillows;Position options;Coconut oil  Lactation Tools Discussed/Used Tools: Coconut oil WIC Program: Yes   Consult Status Consult Status: Follow-up Date: 11/01/18 Follow-up type: Call as needed    Jarold Motto 11/01/2018, 1:02 PM

## 2018-11-01 NOTE — Progress Notes (Signed)
Postpartum Day 0  Patient is scheduled for tubal ligation at 9 AM tomorrow morning with Dr Gilman Schmidt. She is to be NPO at midnight tonight.   Rod Can, CNM

## 2018-11-02 ENCOUNTER — Encounter
Admission: EM | Disposition: A | Discharge status: Home / Self Care | Payer: Self-pay | Source: Home / Self Care | Attending: Obstetrics & Gynecology

## 2018-11-02 ENCOUNTER — Inpatient Hospital Stay: Payer: Medicaid Other | Admitting: Certified Registered Nurse Anesthetist

## 2018-11-02 ENCOUNTER — Encounter: Payer: Self-pay | Admitting: *Deleted

## 2018-11-02 DIAGNOSIS — Z302 Encounter for sterilization: Secondary | ICD-10-CM

## 2018-11-02 HISTORY — PX: TUBAL LIGATION: SHX77

## 2018-11-02 LAB — CBC
HCT: 29.3 % — ABNORMAL LOW (ref 36.0–46.0)
Hemoglobin: 10.1 g/dL — ABNORMAL LOW (ref 12.0–15.0)
MCH: 34 pg (ref 26.0–34.0)
MCHC: 34.5 g/dL (ref 30.0–36.0)
MCV: 98.7 fL (ref 80.0–100.0)
Platelets: 255 10*3/uL (ref 150–400)
RBC: 2.97 MIL/uL — ABNORMAL LOW (ref 3.87–5.11)
RDW: 13.5 % (ref 11.5–15.5)
WBC: 13.1 10*3/uL — ABNORMAL HIGH (ref 4.0–10.5)
nRBC: 0 % (ref 0.0–0.2)

## 2018-11-02 LAB — RPR: RPR Ser Ql: NONREACTIVE

## 2018-11-02 SURGERY — LIGATION, FALLOPIAN TUBE, POSTPARTUM
Anesthesia: Spinal | Laterality: Bilateral

## 2018-11-02 MED ORDER — CEFAZOLIN SODIUM-DEXTROSE 2-4 GM/100ML-% IV SOLN
2.0000 g | INTRAVENOUS | Status: DC
  Filled 2018-11-02: qty 100

## 2018-11-02 MED ORDER — PROPOFOL 10 MG/ML IV BOLUS
INTRAVENOUS | Status: AC
  Filled 2018-11-02: qty 20

## 2018-11-02 MED ORDER — OXYCODONE-ACETAMINOPHEN 5-325 MG PO TABS
1.0000 | ORAL_TABLET | ORAL | Status: DC | PRN
  Administered 2018-11-02: 1 via ORAL
  Filled 2018-11-02: qty 1

## 2018-11-02 MED ORDER — EPHEDRINE SULFATE 50 MG/ML IJ SOLN
INTRAMUSCULAR | Status: AC
  Filled 2018-11-02: qty 1

## 2018-11-02 MED ORDER — SODIUM CHLORIDE (PF) 0.9 % IJ SOLN
INTRAMUSCULAR | Status: AC
  Filled 2018-11-02: qty 20

## 2018-11-02 MED ORDER — BUPIVACAINE HCL 0.5 % IJ SOLN
INTRAMUSCULAR | Status: DC | PRN
  Administered 2018-11-02: 30 mL

## 2018-11-02 MED ORDER — ONDANSETRON HCL 4 MG/2ML IJ SOLN
INTRAMUSCULAR | Status: DC | PRN
  Administered 2018-11-02: 4 mg via INTRAVENOUS

## 2018-11-02 MED ORDER — MIDAZOLAM HCL 2 MG/2ML IJ SOLN
INTRAMUSCULAR | Status: AC
  Filled 2018-11-02: qty 2

## 2018-11-02 MED ORDER — MIDAZOLAM HCL 5 MG/5ML IJ SOLN
INTRAMUSCULAR | Status: DC | PRN
  Administered 2018-11-02: 2 mg via INTRAVENOUS

## 2018-11-02 MED ORDER — FENTANYL CITRATE (PF) 100 MCG/2ML IJ SOLN: 25.0000 ug | INTRAMUSCULAR | Status: DC | PRN

## 2018-11-02 MED ORDER — LIDOCAINE HCL (CARDIAC) PF 100 MG/5ML IV SOSY
PREFILLED_SYRINGE | INTRAVENOUS | Status: DC | PRN
  Administered 2018-11-02: 80 mg via INTRAVENOUS

## 2018-11-02 MED ORDER — SEVOFLURANE IN SOLN
RESPIRATORY_TRACT | Status: AC
  Filled 2018-11-02: qty 250

## 2018-11-02 MED ORDER — BUPIVACAINE IN DEXTROSE 0.75-8.25 % IT SOLN
INTRATHECAL | Status: DC | PRN
  Administered 2018-11-02: 2 mL via INTRATHECAL

## 2018-11-02 MED ORDER — LACTATED RINGERS IV SOLN: INTRAVENOUS | Status: DC

## 2018-11-02 MED ORDER — FENTANYL CITRATE (PF) 100 MCG/2ML IJ SOLN
INTRAMUSCULAR | Status: AC
  Filled 2018-11-02: qty 2

## 2018-11-02 MED ORDER — PHENYLEPHRINE HCL (PRESSORS) 10 MG/ML IV SOLN
INTRAVENOUS | Status: DC | PRN
  Administered 2018-11-02 (×2): 100 ug via INTRAVENOUS

## 2018-11-02 MED ORDER — PROPOFOL 10 MG/ML IV BOLUS
INTRAVENOUS | Status: DC | PRN
  Administered 2018-11-02: 40 mg via INTRAVENOUS
  Administered 2018-11-02: 26 mg via INTRAVENOUS

## 2018-11-02 MED ORDER — ONDANSETRON HCL 4 MG/2ML IJ SOLN
INTRAMUSCULAR | Status: AC
  Filled 2018-11-02: qty 2

## 2018-11-02 MED ORDER — DEXAMETHASONE SODIUM PHOSPHATE 10 MG/ML IJ SOLN
INTRAMUSCULAR | Status: DC | PRN
  Administered 2018-11-02: 10 mg via INTRAVENOUS

## 2018-11-02 MED ORDER — IBUPROFEN 600 MG PO TABS: 600.0000 mg | ORAL_TABLET | Freq: Four times a day (QID) | ORAL | 0 refills | Status: DC

## 2018-11-02 MED ORDER — CEFAZOLIN SODIUM-DEXTROSE 2-3 GM-%(50ML) IV SOLR
INTRAVENOUS | Status: DC | PRN
  Administered 2018-11-02: 2 g via INTRAVENOUS

## 2018-11-02 MED ORDER — LIDOCAINE HCL (PF) 2 % IJ SOLN
INTRAMUSCULAR | Status: AC
  Filled 2018-11-02: qty 10

## 2018-11-02 MED ORDER — ROCURONIUM BROMIDE 100 MG/10ML IV SOLN
INTRAVENOUS | Status: AC
  Filled 2018-11-02: qty 1

## 2018-11-02 MED ORDER — LACTATED RINGERS IV SOLN
INTRAVENOUS | Status: DC | PRN
  Administered 2018-11-02: 08:00:00 via INTRAVENOUS

## 2018-11-02 MED ORDER — DEXAMETHASONE SODIUM PHOSPHATE 10 MG/ML IJ SOLN
INTRAMUSCULAR | Status: AC
  Filled 2018-11-02: qty 1

## 2018-11-02 MED ORDER — PHENYLEPHRINE HCL (PRESSORS) 10 MG/ML IV SOLN
INTRAVENOUS | Status: AC
  Filled 2018-11-02: qty 1

## 2018-11-02 MED ORDER — PROPOFOL 500 MG/50ML IV EMUL
INTRAVENOUS | Status: DC | PRN
  Administered 2018-11-02: 50 ug/kg/min via INTRAVENOUS

## 2018-11-02 MED ORDER — FENTANYL CITRATE (PF) 100 MCG/2ML IJ SOLN
INTRAMUSCULAR | Status: DC | PRN
  Administered 2018-11-02: 20 ug via INTRATHECAL

## 2018-11-02 MED ORDER — ONDANSETRON HCL 4 MG/2ML IJ SOLN: 4.0000 mg | Freq: Once | INTRAMUSCULAR | Status: DC | PRN

## 2018-11-02 MED ORDER — SUGAMMADEX SODIUM 200 MG/2ML IV SOLN
INTRAVENOUS | Status: AC
  Filled 2018-11-02: qty 2

## 2018-11-02 MED ORDER — OXYCODONE-ACETAMINOPHEN 5-325 MG PO TABS: 1.0000 | ORAL_TABLET | ORAL | 0 refills | Status: DC | PRN

## 2018-11-02 MED ORDER — CEFAZOLIN SODIUM 1 G IJ SOLR
INTRAMUSCULAR | Status: AC
  Filled 2018-11-02: qty 20

## 2018-11-02 SURGICAL SUPPLY — 27 items
BLADE SURG SZ11 CARB STEEL (BLADE) ×3 IMPLANT
CHLORAPREP W/TINT 26 (MISCELLANEOUS) ×3 IMPLANT
DERMABOND ADVANCED (GAUZE/BANDAGES/DRESSINGS) ×2
DERMABOND ADVANCED .7 DNX12 (GAUZE/BANDAGES/DRESSINGS) ×1 IMPLANT
DRAPE LAPAROTOMY 100X77 ABD (DRAPES) ×3 IMPLANT
ELECT CAUTERY BLADE 6.4 (BLADE) ×3 IMPLANT
ELECT REM PT RETURN 9FT ADLT (ELECTROSURGICAL) ×3
ELECTRODE REM PT RTRN 9FT ADLT (ELECTROSURGICAL) ×1 IMPLANT
GLOVE BIOGEL PI IND STRL 6.5 (GLOVE) ×2 IMPLANT
GLOVE BIOGEL PI INDICATOR 6.5 (GLOVE) ×4
GLOVE SURG SYN 6.5 ES PF (GLOVE) ×6 IMPLANT
GLOVE SURG SYN 6.5 PF PI (GLOVE) ×2 IMPLANT
GOWN STRL REUS W/ TWL LRG LVL3 (GOWN DISPOSABLE) ×2 IMPLANT
GOWN STRL REUS W/TWL LRG LVL3 (GOWN DISPOSABLE) ×4
LABEL OR SOLS (LABEL) ×3 IMPLANT
NEEDLE HYPO 22GX1.5 SAFETY (NEEDLE) ×3 IMPLANT
NS IRRIG 500ML POUR BTL (IV SOLUTION) ×3 IMPLANT
PACK BASIN MINOR ARMC (MISCELLANEOUS) ×3 IMPLANT
RETRACTOR RING XSMALL (MISCELLANEOUS) ×1 IMPLANT
RTRCTR WOUND ALEXIS 13CM XS SH (MISCELLANEOUS) ×3
SPONGE LAP 4X18 RFD (DISPOSABLE) ×5 IMPLANT
SUT CHROMIC 0 CT 1 (SUTURE) ×6 IMPLANT
SUT MNCRL 4-0 (SUTURE) ×2
SUT MNCRL 4-0 27XMFL (SUTURE) ×1
SUT VICRYL 0 AB UR-6 (SUTURE) ×3 IMPLANT
SUTURE MNCRL 4-0 27XMF (SUTURE) ×1 IMPLANT
SYR 10ML LL (SYRINGE) ×3 IMPLANT

## 2018-11-02 NOTE — Anesthesia Postprocedure Evaluation (Signed)
Anesthesia Post Note  Patient: Karen Wallace  Procedure(s) Performed: AN AD HOC LABOR EPIDURAL  Patient location during evaluation: Mother Baby Anesthesia Type: Epidural Level of consciousness: awake, awake and alert and oriented Pain management: pain level controlled Vital Signs Assessment: post-procedure vital signs reviewed and stable Respiratory status: spontaneous breathing, nonlabored ventilation and respiratory function stable Cardiovascular status: blood pressure returned to baseline and stable Postop Assessment: no headache and no backache Anesthetic complications: no     Last Vitals:  Vitals:   11/02/18 1052 11/02/18 1120  BP: 109/73 117/90  Pulse: 66 69  Resp: (!) 21 20  Temp:  (!) 36.4 C  SpO2: 98% 98%    Last Pain:  Vitals:   11/02/18 1120  TempSrc: Oral  PainSc: 0-No pain                 [employer]

## 2018-11-02 NOTE — Anesthesia Post-op Follow-up Note (Signed)
Anesthesia QCDR form completed.        

## 2018-11-02 NOTE — Op Note (Signed)
Operative Note  11/02/2018  PRE-OP DIAGNOSIS: Desire for permanent sterilization  POST-OP DIAGNOSIS: same  SURGEON: Christanna Schuman MD  PROCEDURE: Postpartum Bilateral Tubal Ligation   ANESTHESIA: General   ESTIMATED BLOOD LOSS: less than 10 cc  SPECIMENS: Portion of left and right tube  COMPLICATIONS: none  DISPOSITION: PACU - hemodynamically stable.  CONDITION: stable  FINDINGS: Exam under anesthesia revealed small, mobile 20 cm uterus with no masses and bilateral adnexa without masses or fullness.   TECHNIQUE:  The patient was  prepped and draped in usual sterile fashion after adequate anesthesia is obtained in the supine position on the operating room table.  Local anesthesia is injected into the skin just inferior to the umbilicus, followed by a small elliptical incision with a scapel.  Fascia is identified and tented upwards, and an incision is made with Mayo scissors.  Identification of no adherent bowel is made. Retractors are placed and trendelenburg positioning is achieved.    The right Fallopian tube was identified, grasped with the Babcock clamps, lifted to the skin incision and followed out distally to the fimbriae. An avascular midsection of the tube approximately 3-4cm from the cornua was grasped with the babcock clamps and brought into a knuckle at the skin incision. The tube was double ligated with 0-0 Chromic suture and the intervening portion of tube was transected and removed. Excellent hemostasis was noted and the tube was returned to the abdomen. Attention was then turned to the left fallopian tube after confirmation of identification by tracing the tube out to the fimbriae. The same procedure was then performed on the right Fallopian tube. Again, excellent hemostasis was noted at the end of the procedure.  Retractors are removed and fascia closed with a 0-0 Vicryl suture. Irrigation and hemostasis confirmed.  Skin closed with a 4-0 monocryl suture in a  subcuticular fashion followed by skin adhesive.  Pt goes to recovery room in stable fashion.  All counts correct times 2.  Adrian Prows MD [facility] 11/02/2018 10:15 AM

## 2018-11-02 NOTE — Discharge Summary (Signed)
Pt dc and f/u appt reviewed. Prescriptions already called in. Pt demonstrates understanding of all dc instructions. dc'd to home via w/c with infant in arms.

## 2018-11-02 NOTE — Anesthesia Procedure Notes (Addendum)
Spinal  Patient location during procedure: OR Start time: 11/02/2018 8:35 AM End time: 11/02/2018 8:38 AM Staffing Anesthesiologist: Martha Clan, MD Resident/CRNA: Eben Burow, CRNA Performed: resident/CRNA  Preanesthetic Checklist Completed: patient identified, site marked, surgical consent, pre-op evaluation, timeout performed, IV checked, risks and benefits discussed and monitors and equipment checked Spinal Block Patient position: sitting Prep: Betadine and site prepped and draped Patient monitoring: heart rate, continuous pulse ox and blood pressure Approach: midline Location: L3-4 Injection technique: single-shot Needle Needle type: Pencan  Needle gauge: 24 G Needle length: 9 cm Assessment Sensory level: T6 Additional Notes Patient identified and kit expiration date verified prior to starting the procedure.  Patient with good working IV.  Clear CSF flow prior to injection of medications.  No paresthesias and patient tolerated the procedure well.

## 2018-11-02 NOTE — Interval H&P Note (Signed)
History and Physical Interval Note:  11/02/2018 7:55 AM  Karen Wallace  has presented today for surgery, with the diagnosis of n/a.  The various methods of treatment have been discussed with the patient and family. After consideration of risks, benefits and other options for treatment, the patient has consented to  Procedure(s): POST PARTUM TUBAL LIGATION (Bilateral) as a surgical intervention.  The patient's history has been reviewed, patient examined, no change in status, stable for surgery.  I have reviewed the patient's chart and labs.  Questions were answered to the patient's satisfaction.     [place]

## 2018-11-02 NOTE — Transfer of Care (Signed)
Immediate Anesthesia Transfer of Care Note  Patient: Karen Wallace  Procedure(s) Performed: POST PARTUM TUBAL LIGATION (Bilateral )  Patient Location: PACU  Anesthesia Type:Spinal  Level of Consciousness: drowsy  Airway & Oxygen Therapy: Patient Spontanous Breathing and Patient connected to nasal cannula oxygen  Post-op Assessment: Report given to RN and Post -op Vital signs reviewed and stable  Post vital signs: Reviewed and stable  Last Vitals:  Vitals Value Taken Time  BP 89/50 11/02/18 0936  Temp    Pulse 76 11/02/18 0936  Resp 16 11/02/18 0936  SpO2 94 % 11/02/18 0936  Vitals shown include unvalidated device data.  Last Pain:  Vitals:   11/02/18 0807  TempSrc: Tympanic  PainSc: 0-No pain         Complications: No apparent anesthesia complications

## 2018-11-02 NOTE — Anesthesia Preprocedure Evaluation (Signed)
Anesthesia Evaluation  Patient identified by MRN, date of birth, ID band Patient awake    Reviewed: Allergy & Precautions, H&P , NPO status , Patient's Chart, lab work & pertinent test results  History of Anesthesia Complications Negative for: history of anesthetic complications  Airway Mallampati: III  TM Distance: >3 FB Neck ROM: full    Dental  (+) Teeth Intact   Pulmonary neg pulmonary ROS, Current Smoker,           Cardiovascular Exercise Tolerance: Good hypertension, On Medications + Valvular Problems/Murmurs      Neuro/Psych PSYCHIATRIC DISORDERS Anxiety Depression    GI/Hepatic Neg liver ROS, GERD  ,  Endo/Other  negative endocrine ROS  Renal/GU negative Renal ROS  negative genitourinary   Musculoskeletal   Abdominal   Peds  Hematology negative hematology ROS (+)   Anesthesia Other Findings Past Medical History: No date: Abnormal Pap smear of cervix     Comment:  2006/2010 No date: Anxiety No date: Depression 09/2017: 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 No date: GERD (gastroesophageal reflux disease)     Comment:  OTC GUMMIES PRN No date: Heart murmur     Comment:  AS A  CHILD No date: Hypertension 05/23/2015: Indication for care in labor and delivery, antepartum 05/25/2015: Postpartum care following vaginal delivery 04/11/2015: Preterm contractions  Past Surgical History: april 2016: APPENDECTOMY 11/19/2016: CHOLECYSTECTOMY; N/A     Comment:  Procedure: LAPAROSCOPIC CHOLECYSTECTOMY;  Surgeon:               Olean Ree, MD;  Location: [facility] ORS;  Service:               General;  Laterality: N/A; No date: colposcopy of cervix     Comment:  2007/2010 No date: DILATION AND CURETTAGE OF UTERUS  BMI    Body Mass Index: 34.36 kg/m      Reproductive/Obstetrics (+) Pregnancy                             Anesthesia Physical  Anesthesia Plan  ASA:  III  Anesthesia Plan: Spinal   Post-op Pain Management:    Induction:   PONV Risk Score and Plan: 2 and Midazolam  Airway Management Planned: Natural Airway, Nasal Cannula and Simple Face Mask  Additional Equipment:   Intra-op Plan:   Post-operative Plan:   Informed Consent: I have reviewed the patients History and Physical, chart, labs and discussed the procedure including the risks, benefits and alternatives for the proposed anesthesia with the patient or authorized representative who has indicated his/her understanding and acceptance.       Plan Discussed with: Anesthesiologist  Anesthesia Plan Comments:         Anesthesia Quick Evaluation

## 2018-11-04 LAB — SURGICAL PATHOLOGY

## 2018-11-04 NOTE — Anesthesia Postprocedure Evaluation (Signed)
Anesthesia Post Note  Patient: [name]  Procedure(s) Performed: POST PARTUM TUBAL LIGATION (Bilateral )  Patient location during evaluation: PACU Anesthesia Type: Spinal Level of consciousness: oriented and awake and alert Pain management: pain level controlled Vital Signs Assessment: post-procedure vital signs reviewed and stable Respiratory status: spontaneous breathing, respiratory function stable and patient connected to nasal cannula oxygen Cardiovascular status: blood pressure returned to baseline and stable Postop Assessment: no headache, no backache, no apparent nausea or vomiting and spinal receding Anesthetic complications: no     Last Vitals:  Vitals:   11/02/18 1524 11/02/18 1639  BP: 140/86 128/74  Pulse: 80 80  Resp: 18   Temp: 37 C   SpO2: 98%     Last Pain:  Vitals:   11/02/18 1715  TempSrc:   PainSc: 2                  Martha Clan

## 2018-11-08 ENCOUNTER — Encounter: Payer: Self-pay | Admitting: Advanced Practice Midwife

## 2018-11-08 ENCOUNTER — Other Ambulatory Visit: Payer: Self-pay

## 2018-11-08 ENCOUNTER — Ambulatory Visit (INDEPENDENT_AMBULATORY_CARE_PROVIDER_SITE_OTHER): Payer: Medicaid Other | Admitting: Advanced Practice Midwife

## 2018-11-08 VITALS — BP 146/92 | Ht 66.0 in | Wt 211.0 lb

## 2018-11-08 DIAGNOSIS — O10919 Unspecified pre-existing hypertension complicating pregnancy, unspecified trimester: Secondary | ICD-10-CM

## 2018-11-08 DIAGNOSIS — Z9889 Other specified postprocedural states: Secondary | ICD-10-CM

## 2018-11-08 DIAGNOSIS — O0990 Supervision of high risk pregnancy, unspecified, unspecified trimester: Secondary | ICD-10-CM

## 2018-11-08 DIAGNOSIS — Z0130 Encounter for examination of blood pressure without abnormal findings: Secondary | ICD-10-CM

## 2018-11-08 DIAGNOSIS — A6000 Herpesviral infection of urogenital system, unspecified: Secondary | ICD-10-CM

## 2018-11-08 MED ORDER — NIFEDIPINE ER OSMOTIC RELEASE 90 MG PO TB24: 90.0000 mg | ORAL_TABLET | Freq: Every day | ORAL | 11 refills | Status: AC

## 2018-11-08 MED ORDER — VALACYCLOVIR HCL 500 MG PO TABS: 500.0000 mg | ORAL_TABLET | Freq: Every day | ORAL | 11 refills | Status: DC

## 2018-11-08 NOTE — Progress Notes (Signed)
Obstetrics & Gynecology Office Visit   Chief Complaint:  Chief Complaint  Patient presents with  . Blood Pressure Check    History of Present Illness: 33 y.o. [id number] being seen for follow up blood pressure check today.  The patient is 1 week postpartumThe established diagnosis for the patient is chronic hypertension.  She is currently on nifedipine ER 30mg .  She reports headaches.  Medication list reviewed medications which may contribute to BP elevation were not noted and no medications contraindicated for use in patient with current hypertension were noted.  She has had headaches since delivery that are relieved by tylenol. She denies adequate hydration and sleep. She started taking 60 mg Nifedipine which did not relieve the headaches so the past 2 days she has taken 90 mg which has helped her headaches.   She has also had pelvic pain that is cramping and some sharp bilateral lower pelvic pain that is improving. The baby prefers her left breast. She has a bump on her right nipple that is not painful. Suggested she continue to nurse baby on both breasts. She noticed a herpes lesion a few days after delivery and has restarted her valtrex. She did not have the lesion at the time of delivery.   She requests a note for work to give her at least 4 weeks recovery time since her employer wants her to return at only 2 weeks postpartum.     Review of Systems: Review of Systems  Constitutional: Negative.   HENT: Negative.   Eyes: Negative.   Respiratory: Negative.   Cardiovascular: Negative.   Gastrointestinal: Negative.   Genitourinary:       Genital herpes lesion   Musculoskeletal: Positive for joint pain.  Skin: Negative.   Neurological: Positive for headaches.  Endo/Heme/Allergies: Negative.   Psychiatric/Behavioral: Negative.     Past Medical History:  Past Medical History:  Diagnosis Date  . Abnormal Pap smear of cervix    2006/2010  . Anxiety   . Depression   . 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 GERD (gastroesophageal reflux disease)    OTC GUMMIES PRN  . Heart murmur    AS A  CHILD  . Hypertension   . Indication for care in labor and delivery, antepartum 05/23/2015  . Postpartum care following vaginal delivery 05/25/2015  . Preterm contractions 04/11/2015    Past Surgical History:  Past Surgical History:  Procedure Laterality Date  . APPENDECTOMY  april 2016  . CHOLECYSTECTOMY N/A 11/19/2016   Procedure: LAPAROSCOPIC CHOLECYSTECTOMY;  Surgeon: Olean Ree, MD;  Location: [facility] ORS;  Service: General;  Laterality: N/A;  . colposcopy of cervix     2007/2010  . DILATION AND CURETTAGE OF UTERUS    . TUBAL LIGATION Bilateral 11/02/2018   Procedure: POST PARTUM TUBAL LIGATION;  Surgeon: Homero Fellers, MD;  Location: [facility] ORS;  Service: Gynecology;  Laterality: Bilateral;    Gynecologic History: Patient's last menstrual period was 01/20/2018.  Obstetric History: [id number]  Family History:  Family History  Problem Relation Age of Onset  . Hypertension Mother   . Depression Mother   . Hypertension Father     Social History:  Social History   Socioeconomic History  . Marital status: Single    Spouse name: Not on file  . Number of children: Not on file  . Years of education: Not on file  . Highest education level: Not on file  Occupational History  . Not  on file  Social Needs  . Financial resource strain: Not on file  . Food insecurity    Worry: Not on file    Inability: Not on file  . Transportation needs    Medical: Not on file    Non-medical: Not on file  Tobacco Use  . Smoking status: Current Every Day Smoker    Packs/day: 0.25    Years: 10.00    Pack years: 2.50    Types: Cigarettes  . Smokeless tobacco: Never Used  Substance and Sexual Activity  . Alcohol use: Not Currently    Comment: OCC  . Drug use: No  . Sexual activity: Yes    Birth control/protection: Surgical  Lifestyle  . Physical activity     Days per week: Not on file    Minutes per session: Not on file  . Stress: Not on file  Relationships  . Social Musicianconnections    Talks on phone: Not on file    Gets together: Not on file    Attends religious service: Not on file    Active member of club or organization: Not on file    Attends meetings of clubs or organizations: Not on file    Relationship status: Not on file  . Intimate partner violence    Fear of current or ex partner: Not on file    Emotionally abused: Not on file    Physically abused: Not on file    Forced sexual activity: Not on file  Other Topics Concern  . Not on file  Social History Narrative  . Not on file    Allergies:  Allergies  Allergen Reactions  . Lisinopril Cough    Medications: Prior to Admission medications   Medication Sig Start Date End Date Taking? Authorizing Provider  ibuprofen (ADVIL) 600 MG tablet Take 1 tablet (600 mg total) by mouth every 6 (six) hours. 11/02/18  Yes Vena AustriaStaebler, Andreas, MD  NIFEdipine (PROCARDIA-XL/NIFEDICAL-XL) 30 MG 24 hr tablet Take 1 tablet (30 mg total) by mouth daily. Can increase to twice a day as needed for symptomatic contractions 09/22/18  Yes Schuman, Christanna R, MD  oxyCODONE-acetaminophen (PERCOCET/ROXICET) 5-325 MG tablet Take 1 tablet by mouth every 4 (four) hours as needed for moderate pain or severe pain. 11/02/18  Yes Vena AustriaStaebler, Andreas, MD  Prenat w/o A Vit-FeFum-FePo-FA (PROVIDA OB) 20-20-1.25 MG CAPS Take 1 tablet by mouth daily. 04/19/18  Yes Tresea MallGledhill, Aeriana Speece, CNM  busPIRone (BUSPAR) 10 MG tablet TK 1/2 TO 1 T PO BID FOR MOOD 07/15/17   [provider]    Physical Exam Blood pressure (!) 146/92, height 5\' 6"  (1.676 m), weight 211 lb (95.7 kg), last menstrual period 01/20/2018, currently breastfeeding.  Patient's last menstrual period was 01/20/2018.  General: NAD HEENT: normocephalic, anicteric Pulmonary: No increased work of breathing Cardiovascular: RRR, distal pulses 2+ Extremities:  trace edema, no erythema, no tenderness Neurologic: Grossly intact Psychiatric: mood appropriate, affect full Breast: Right nipple with small milk bleb  Assessment: 33 y.o. [id number] presenting for blood pressure evaluation today  Plan: Problem List Items Addressed This Visit      Cardiovascular and Mediastinum   Chronic hypertension affecting pregnancy   Relevant Medications   NIFEdipine (PROCARDIA XL/NIFEDICAL-XL) 90 MG 24 hr tablet     Genitourinary   Genital herpes   Relevant Medications   valACYclovir (VALTREX) 500 MG tablet     Other   Supervision of high risk pregnancy, antepartum    Other Visit Diagnoses    Blood pressure  check    -  Primary      1) Blood pressure - blood pressure at today's visit is mildly elevated.  As a result adjustments were made to the patient's antihypertensive therapyTake Nifedipine 90 mg daily   - additional blood work was not obtained   2) Return to clinic in 2 weeks for follow up BP  3) Start daily suppressive dose of Valtrex  4) Warm compress to right nipple to relieve milk bleb   Tresea MallJane Rashema Seawright, CNM [facility] 11/08/2018, 10:39 AM

## 2018-11-08 NOTE — Patient Instructions (Signed)
Postpartum Hypertension °Postpartum hypertension is high blood pressure that remains higher than normal after childbirth. You may not realize that you have postpartum hypertension if your blood pressure is not being checked regularly. In most cases, postpartum hypertension will go away on its own, usually within a week of delivery. However, for some women, medical treatment is required to prevent serious complications, such as seizures or stroke. °What are the causes? °This condition may be caused by one or more of the following: °· Hypertension that existed before pregnancy (chronic hypertension). °· Hypertension that comes on as a result of pregnancy (gestational hypertension). °· Hypertensive disorders during pregnancy (preeclampsia) or seizures in women who have high blood pressure during pregnancy (eclampsia). °· A condition in which the liver, platelets, and red blood cells are damaged during pregnancy (HELLP syndrome). °· A condition in which the thyroid produces too much hormones (hyperthyroidism). °· Other rare problems of the nerves (neurological disorders) or blood disorders. °In some cases, the cause may not be known. °What increases the risk? °The following factors may make you more likely to develop this condition: °· Chronic hypertension. In some cases, this may not have been diagnosed before pregnancy. °· Obesity. °· Type 2 diabetes. °· Kidney disease. °· History of preeclampsia or eclampsia. °· Other medical conditions that change the level of hormones in the body (hormonal imbalance). °What are the signs or symptoms? °As with all types of hypertension, postpartum hypertension may not have any symptoms. Depending on how high your blood pressure is, you may experience: °· Headaches. These may be mild, moderate, or severe. They may also be steady, constant, or sudden in onset (thunderclap headache). °· Changes in your ability to see (visual changes). °· Dizziness. °· Shortness of breath. °· Swelling  of your hands, feet, lower legs, or face. In some cases, you may have swelling in more than one of these locations. °· Heart palpitations or a racing heartbeat. °· Difficulty breathing while lying down. °· Decrease in the amount of urine that you pass. °Other rare signs and symptoms may include: °· Sweating more than usual. This lasts longer than a few days after delivery. °· Chest pain. °· Sudden dizziness when you get up from sitting or lying down. °· Seizures. °· Nausea or vomiting. °· Abdominal pain. °How is this diagnosed? °This condition may be diagnosed based on the results of a physical exam, blood pressure measurements, and blood and urine tests. °You may also have other tests, such as a CT scan or an MRI, to check for other problems of postpartum hypertension. °How is this treated? °If blood pressure is high enough to require treatment, your options may include: °· Medicines to reduce blood pressure (antihypertensives). Tell your health care provider if you are breastfeeding or if you plan to breastfeed. There are many antihypertensive medicines that are safe to take while breastfeeding. °· Stopping medicines that may be causing hypertension. °· Treating medical conditions that are causing hypertension. °· Treating the complications of hypertension, such as seizures, stroke, or kidney problems. °Your health care provider will also continue to monitor your blood pressure closely until it is within a safe range for you. °Follow these instructions at home: °· Take over-the-counter and prescription medicines only as told by your health care provider. °· Return to your normal activities as told by your health care provider. Ask your health care provider what activities are safe for you. °· Do not use any products that contain nicotine or tobacco, such as cigarettes and e-cigarettes. If   you need help quitting, ask your health care provider. °· Keep all follow-up visits as told by your health care provider. This  is important. °Contact a health care provider if: °· Your symptoms get worse. °· You have new symptoms, such as: °? A headache that does not get better. °? Dizziness. °? Visual changes. °Get help right away if: °· You suddenly develop swelling in your hands, ankles, or face. °· You have sudden, rapid weight gain. °· You develop difficulty breathing, chest pain, racing heartbeat, or heart palpitations. °· You develop severe pain in your abdomen. °· You have any symptoms of a stroke. "BE FAST" is an easy way to remember the main warning signs of a stroke: °? B - Balance. Signs are dizziness, sudden trouble walking, or loss of balance. °? E - Eyes. Signs are trouble seeing or a sudden change in vision. °? F - Face. Signs are sudden weakness or numbness of the face, or the face or eyelid drooping on one side. °? A - Arms. Signs are weakness or numbness in an arm. This happens suddenly and usually on one side of the body. °? S - Speech. Signs are sudden trouble speaking, slurred speech, or trouble understanding what people say. °? T - Time. Time to call emergency services. Write down what time symptoms started. °· You have other signs of a stroke, such as: °? A sudden, severe headache with no known cause. °? Nausea or vomiting. °? Seizure. °These symptoms may represent a serious problem that is an emergency. Do not wait to see if the symptoms will go away. Get medical help right away. Call your local emergency services (911 in the [place] Do not drive yourself to the hospital. °Summary °· Postpartum hypertension is high blood pressure that remains higher than normal after childbirth. °· In most cases, postpartum hypertension will go away on its own, usually within a week of delivery. °· For some women, medical treatment is required to prevent serious complications, such as seizures or stroke. °This information is not intended to replace advice given to you by your health care provider. Make sure you discuss any questions  you have with your health care provider. °Document Released: 12/09/2013 Document Revised: 05/14/2018 Document Reviewed: 01/26/2017 °[employer]vier Patient Education © 2020 [employer] ° °

## 2019-01-25 ENCOUNTER — Ambulatory Visit (INDEPENDENT_AMBULATORY_CARE_PROVIDER_SITE_OTHER): Payer: Medicaid Other | Admitting: Advanced Practice Midwife

## 2019-01-25 ENCOUNTER — Other Ambulatory Visit: Payer: Self-pay

## 2019-01-25 ENCOUNTER — Other Ambulatory Visit (HOSPITAL_COMMUNITY)
Admission: RE | Admit: 2019-01-25 | Discharge: 2019-01-25 | Disposition: A | Discharge status: Home / Self Care | Payer: Medicaid Other | Source: Ambulatory Visit | Attending: Advanced Practice Midwife | Admitting: Advanced Practice Midwife

## 2019-01-25 ENCOUNTER — Encounter: Payer: Self-pay | Admitting: Advanced Practice Midwife

## 2019-01-25 DIAGNOSIS — Z23 Encounter for immunization: Secondary | ICD-10-CM

## 2019-01-25 DIAGNOSIS — F419 Anxiety disorder, unspecified: Secondary | ICD-10-CM

## 2019-01-25 DIAGNOSIS — Z113 Encounter for screening for infections with a predominantly sexual mode of transmission: Secondary | ICD-10-CM

## 2019-01-25 DIAGNOSIS — Z1389 Encounter for screening for other disorder: Secondary | ICD-10-CM

## 2019-01-25 MED ORDER — BUSPIRONE HCL 10 MG PO TABS: 10.0000 mg | ORAL_TABLET | Freq: Every day | ORAL | 11 refills | Status: AC

## 2019-01-25 NOTE — Patient Instructions (Signed)
Perinatal Anxiety When a woman feels excessive tension or worry (anxiety) during pregnancy or during the first 12 months after she gives birth, she has a condition called perinatal anxiety. Anxiety can interfere with work, school, relationships, and other everyday activities. If it is not managed properly, it can also cause problems in the mother and her baby.  If you are pregnant and you have symptoms of an anxiety disorder, it is important to talk with your health care provider. What are the causes? The exact cause of this condition is not known. Hormonal changes during and after pregnancy may play a role in causing perinatal anxiety. What increases the risk? You are more likely to develop this condition if:  You have a personal or family history of depression, anxiety, or mood disorders.  You experience a stressful life event during pregnancy, such as the death of a loved one.  You have a lot of regular life stress, such as being a single parent.  You have thyroid problems. What are the signs or symptoms? Perinatal anxiety can be different for everyone. It may include:  Panic attacks (panic disorder). These are intense episodes of fear or discomfort that may also cause sweating, nausea, shortness of breath, or fear of dying. They usually last 5-15 minutes.  Reliving an upsetting (traumatic) event through distressing thoughts, dreams, or flashbacks (post-traumatic stress disorder, or PTSD).  Excessive worry about multiple problems (generalized anxiety disorder).  Fear and stress about leaving certain people or loved ones (separation anxiety).  Performing repetitive tasks (compulsions) to relieve stress or worry (obsessive compulsive disorder, or OCD).  Fear of certain objects or situations (phobias).  Excessive worrying, such as a constant feeling that something bad is going to happen.  Inability to relax.  Difficulty concentrating.  Sleep problems.  Frequent nightmares or  disturbing thoughts. How is this diagnosed? This condition is diagnosed based on a physical exam and mental evaluation. In some cases, your health care provider may use an anxiety screening tool. These tools include a list of questions that can help a health care provider diagnose anxiety. Your health care provider may refer you to a mental health expert who specializes in anxiety. How is this treated? This condition may be treated with:  Medicines. Your health care provider will only give you medicines that have been proven safe for pregnancy and breastfeeding.  Talk therapy with a mental health professional to help change your patterns of thinking (cognitive behavioral therapy).  Mindfulness-based stress reduction.  Other relaxation therapies, such as deep breathing or guided muscle relaxation.  Support groups. Follow these instructions at home: Lifestyle  Do not use any products that contain nicotine or tobacco, such as cigarettes and e-cigarettes. If you need help quitting, ask your health care provider.  Do not use alcohol when you are pregnant. After your baby is born, limit alcohol intake to no more than 1 drink a day. One drink equals 12 oz of beer, 5 oz of wine, or 1 oz of hard liquor.  Consider joining a support group for new mothers. Ask your health care provider for recommendations.  Take good care of yourself. Make sure you: ? Get plenty of sleep. If you are having trouble sleeping, talk with your health care provider. ? Eat a healthy diet. This includes plenty of fruits and vegetables, whole grains, and lean proteins. ? Exercise regularly, as told by your health care provider. Ask your health care provider what exercises are safe for you. General instructions  Take over-the-counter  and prescription medicines only as told by your health care provider.  Talk with your partner or family members about your feelings during pregnancy. Share any concerns or fears that you may  have.  Ask for help with tasks or chores when you need it. Ask friends and family members to provide meals, watch your children, or help with cleaning.  Keep all follow-up visits as told by your health care provider. This is important. Contact a health care provider if:  You (or people close to you) notice that you have any symptoms of anxiety or depression.  You have anxiety and your symptoms get worse.  You experience side effects from medicines, such as nausea or sleep problems. Get help right away if:  You feel like hurting yourself, your baby, or someone else. If you ever feel like you may hurt yourself or others, or have thoughts about taking your own life, get help right away. You can go to your nearest emergency department or call:  Your local emergency services (911 in the [place]  A suicide crisis helpline, such as the National Suicide Prevention Lifeline at 209-315-72611-(862)437-8374. This is open 24 hours a day. Summary  Perinatal anxiety is when a woman feels excessive tension or worry during pregnancy or during the first 12 months after she gives birth.  Perinatal anxiety may include panic attacks, post-traumatic stress disorder, separation anxiety, phobias, or generalized anxiety.  Perinatal anxiety can cause physical health problems in the mother and baby if not properly managed.  This condition is treated with medicines, talk therapy, stress reduction therapies, or a combination of two or more treatments.  Talk with your partner or family members about your concerns or fears. Do not be afraid to ask for help. This information is not intended to replace advice given to you by your health care provider. Make sure you discuss any questions you have with your health care provider. Document Released: 06/04/2016 Document Revised: 04/10/2017 Document Reviewed: 06/04/2016 Elsevier Patient Education  2020 [employer] Perinatal Depression When a woman feels excessive sadness, anger,  or anxiety during pregnancy or during the first 12 months after she gives birth, she has a condition called perinatal depression. Depression can interfere with work, school, relationships, and other everyday activities. If it is not managed properly, it can also cause problems in the mother and her baby. Sometimes, perinatal depression is left untreated because symptoms are thought to be normal mood swings during and right after pregnancy. If you have symptoms of depression, it is important to talk with your health care provider. What are the causes? The exact cause of this condition is not known. Hormonal changes during and after pregnancy may play a role in causing perinatal depression. What increases the risk? You are more likely to develop this condition if:  You have a personal or family history of depression, anxiety, or mood disorders.  You experience a stressful life event during pregnancy, such as the death of a loved one.  You have a lot of regular life stress.  You do not have support from family members or loved ones, or you are in an abusive relationship. What are the signs or symptoms? Symptoms of this condition include:  Feeling sad or hopeless.  Feelings of guilt.  Feeling irritable or overwhelmed.  Changes in your appetite.  Lack of energy or motivation.  Sleep problems.  Difficulty concentrating or completing tasks.  Loss of interest in hobbies or relationships.  Headaches or stomach problems that do not go  away. How is this diagnosed? This condition is diagnosed based on a physical exam and mental evaluation. In some cases, your health care provider may use a depression screening tool. These tools include a list of questions that can help a health care provider diagnose depression. Your health care provider may refer you to a mental health expert who specializes in depression. How is this treated? This condition may be treated with:  Medicines. Your health  care provider will only give you medicines that have been proven safe for pregnancy and breastfeeding.  Talk therapy with a mental health professional to help change your patterns of thinking (cognitive behavioral therapy).  Support groups.  Brain stimulation or light therapies.  Stress reduction therapies, such as mindfulness. Follow these instructions at home: Lifestyle  Do not use any products that contain nicotine or tobacco, such as cigarettes and e-cigarettes. If you need help quitting, ask your health care provider.  Do not use alcohol when you are pregnant. After your baby is born, limit alcohol intake to no more than 1 drink a day. One drink equals 12 oz of beer, 5 oz of wine, or 1 oz of hard liquor.  Consider joining a support group for new mothers. Ask your health care provider for recommendations.  Take good care of yourself. Make sure you: ? Get plenty of sleep. If you are having trouble sleeping, talk with your health care provider. ? Eat a healthy diet. This includes plenty of fruits and vegetables, whole grains, and lean proteins. ? Exercise regularly, as told by your health care provider. Ask your health care provider what exercises are safe for you. General instructions  Take over-the-counter and prescription medicines only as told by your health care provider.  Talk with your partner or family members about your feelings during pregnancy. Share any concerns or anxieties that you may have.  Ask for help with tasks or chores when you need it. Ask friends and family members to provide meals, watch your children, or help with cleaning.  Keep all follow-up visits as told by your health care provider. This is important. Contact a health care provider if:  You (or people close to you) notice that you have any symptoms of depression.  You have depression and your symptoms get worse.  You experience side effects from medicines, such as nausea or sleep problems. Get  help right away if:  You feel like hurting yourself, your baby, or someone else. If you ever feel like you may hurt yourself or others, or have thoughts about taking your own life, get help right away. You can go to your nearest emergency department or call:  Your local emergency services (911 in the [place]  A suicide crisis helpline, such as the [facility] at 717-259-7557. This is open 24 hours a day. Summary  Perinatal depression is when a woman feels excessive sadness, anger, or anxiety during pregnancy or during the first 12 months after she gives birth.  If perinatal depression is not treated, it can lead to health problems for the mother and her baby.  This condition is treated with medicines, talk therapy, stress reduction therapies, or a combination of two or more treatments.  Talk with your partner or family members about your feelings. Do not be afraid to ask for help. This information is not intended to replace advice given to you by your health care provider. Make sure you discuss any questions you have with your health care provider. Document Released: 06/04/2016  Document Revised: 04/10/2017 Document Reviewed: 06/04/2016 Elsevier Patient Education  2020 [employer].

## 2019-01-26 LAB — HIV ANTIBODY (ROUTINE TESTING W REFLEX): HIV Screen 4th Generation wRfx: NONREACTIVE

## 2019-01-26 LAB — HEPATITIS B SURFACE ANTIBODY,QUALITATIVE: Hep B Surface Ab, Qual: NONREACTIVE

## 2019-01-26 LAB — RPR QUALITATIVE: RPR Ser Ql: NONREACTIVE

## 2019-01-28 NOTE — Progress Notes (Signed)
Postpartum Visit  Chief Complaint:  Chief Complaint  Patient presents with  . Postpartum Care    vaginal delivery [facility] 7/13    History of Present Illness: Patient is a 33 y.o. [id number] presents for postpartum visit.  Review the Delivery Report for details.  Date of delivery: 11/01/2018 Type of delivery: Vaginal delivery - Vacuum or forceps assisted  no Episiotomy No.  Laceration: no  Pregnancy or labor problems:  Supervision of high risk pregnancy, antepartum; Trichomonas vaginalis infection; RLQ abdominal pain; Tobacco abuse counseling; HSV-2 infection complicating pregnancy, second trimester; Depression; Anxiety; Tobacco use during pregnancy; Genitourinary infection, candidal; Polyhydramnios affecting pregnancy in third trimester Any problems since the delivery:  Anxiety and depression  Newborn Details:  SINGLETON :  1. BabyGender female. Birth weight: 7 pounds 15 ounces Maternal Details:  Breast or formula feeding: initiated breastfeeding, now formula feeding Intercourse: Yes  Contraception after delivery: bilateral tubal ligation Any bowel or bladder issues: No  Post partum depression/anxiety noted:  Yes she has had a difficult time so far as single parent of 3 children and was out of work. She has recently gotten a new job. The patient remarks that she would not act on her thoughts of self harm due to her children. She also notes that she feels she has "turned the corner" to feeling better Flavia Shipper Post-Partum Depression Score: 16 Date of last PAP: 08/15/2016  no abnormalities   Review of Systems: Review of Systems  Constitutional: Negative.   HENT: Negative.   Eyes: Negative.   Respiratory: Negative.   Cardiovascular: Negative.   Gastrointestinal: Negative.   Genitourinary: Negative.   Musculoskeletal: Negative.   Skin: Negative.   Neurological: Negative.   Endo/Heme/Allergies: Negative.   Psychiatric/Behavioral:       Depression, anxiety     Past Medical  History:  Past Medical History:  Diagnosis Date  . Abnormal Pap smear of cervix    2006/2010  . Anxiety   . Depression   . 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 GERD (gastroesophageal reflux disease)    OTC GUMMIES PRN  . Heart murmur    AS A  CHILD  . Hypertension   . Indication for care in labor and delivery, antepartum 05/23/2015  . Postpartum care following vaginal delivery 05/25/2015  . Preterm contractions 04/11/2015    Past Surgical History:  Past Surgical History:  Procedure Laterality Date  . APPENDECTOMY  april 2016  . CHOLECYSTECTOMY N/A 11/19/2016   Procedure: LAPAROSCOPIC CHOLECYSTECTOMY;  Surgeon: Olean Ree, MD;  Location: [facility] ORS;  Service: General;  Laterality: N/A;  . colposcopy of cervix     2007/2010  . DILATION AND CURETTAGE OF UTERUS    . TUBAL LIGATION Bilateral 11/02/2018   Procedure: POST PARTUM TUBAL LIGATION;  Surgeon: Homero Fellers, MD;  Location: [facility] ORS;  Service: Gynecology;  Laterality: Bilateral;    Family History:  Family History  Problem Relation Age of Onset  . Hypertension Mother   . Depression Mother   . Hypertension Father     Social History:  Social History   Socioeconomic History  . Marital status: Single    Spouse name: Not on file  . Number of children: Not on file  . Years of education: Not on file  . Highest education level: Not on file  Occupational History  . Not on file  Social Needs  . Financial resource strain: Not on file  . Food insecurity  Worry: Not on file    Inability: Not on file  . Transportation needs    Medical: Not on file    Non-medical: Not on file  Tobacco Use  . Smoking status: Current Every Day Smoker    Packs/day: 0.25    Years: 10.00    Pack years: 2.50    Types: Cigarettes  . Smokeless tobacco: Never Used  Substance and Sexual Activity  . Alcohol use: Not Currently    Comment: OCC  . Drug use: No  . Sexual activity: Yes    Birth control/protection:  Surgical    Comment: Tubal Ligatoin  Lifestyle  . Physical activity    Days per week: Not on file    Minutes per session: Not on file  . Stress: Not on file  Relationships  . Social Musicianconnections    Talks on phone: Not on file    Gets together: Not on file    Attends religious service: Not on file    Active member of club or organization: Not on file    Attends meetings of clubs or organizations: Not on file    Relationship status: Not on file  . Intimate partner violence    Fear of current or ex partner: Not on file    Emotionally abused: Not on file    Physically abused: Not on file    Forced sexual activity: Not on file  Other Topics Concern  . Not on file  Social History Narrative  . Not on file    Allergies:  Allergies  Allergen Reactions  . Lisinopril Cough    Medications: Prior to Admission medications   Medication Sig Start Date End Date Taking? Authorizing Provider  busPIRone (BUSPAR) 10 MG tablet Take 1 tablet (10 mg total) by mouth daily. 01/25/19   Tresea MallGledhill, Travontae Freiberger, CNM  ibuprofen (ADVIL) 600 MG tablet Take 1 tablet (600 mg total) by mouth every 6 (six) hours. 11/02/18   Vena AustriaStaebler, Andreas, MD  NIFEdipine (PROCARDIA XL/NIFEDICAL-XL) 90 MG 24 hr tablet Take 1 tablet (90 mg total) by mouth daily. 11/08/18   Tresea MallGledhill, Skyllar Notarianni, CNM  Prenat w/o A Vit-FeFum-FePo-FA (PROVIDA OB) 20-20-1.25 MG CAPS Take 1 tablet by mouth daily. 04/19/18   Tresea MallGledhill, Keylah Darwish, CNM  valACYclovir (VALTREX) 500 MG tablet Take 1 tablet (500 mg total) by mouth daily. 11/08/18   Tresea MallGledhill, Gedeon Brandow, CNM    Physical Exam Blood pressure (!) 142/102, pulse 92, height 5\' 8"  (1.727 m), weight 225 lb (102.1 kg), last menstrual period 01/01/2019, not currently breastfeeding.    General: NAD HEENT: normocephalic, anicteric Pulmonary: No increased work of breathing Abdomen: NABS, soft, non-tender, non-distended.  Umbilicus without lesions.  No hepatomegaly, splenomegaly or masses palpable. No evidence of hernia.  Genitourinary:  External: Normal external female genitalia.  Normal urethral meatus, normal Bartholin's and Skene's glands.    Vagina: Normal vaginal mucosa, no evidence of prolapse.    Cervix: Grossly normal in appearance, no bleeding, no CMT  Uterus: Non-enlarged, mobile, normal contour.    Adnexa: ovaries non-enlarged, no adnexal masses  Rectal: deferred Extremities: no edema, erythema, or tenderness Neurologic: Grossly intact Psychiatric: mood appropriate, affect full  Edinburgh Postnatal Depression Scale - 01/25/19 1443      Edinburgh Postnatal Depression Scale:  In the Past 7 Days   I have been able to laugh and see the funny side of things.  1    I have looked forward with enjoyment to things.  1    I have blamed myself unnecessarily when  things went wrong.  2    I have been anxious or worried for no good reason.  2    I have felt scared or panicky for no good reason.  2    Things have been getting on top of me.  2    I have been so unhappy that I have had difficulty sleeping.  1    I have felt sad or miserable.  2    I have been so unhappy that I have been crying.  1    The thought of harming myself has occurred to me.  2    Edinburgh Postnatal Depression Scale Total  16       Assessment: 33 y.o. [phone] presenting for 6 week postpartum visit  Plan: Problem List Items Addressed This Visit      Other   Anxiety   Relevant Medications   busPIRone (BUSPAR) 10 MG tablet    Other Visit Diagnoses    6 weeks postpartum follow-up    -  Primary   Relevant Orders   Hepatitis B surface antibody,qualitative (Completed)   HIV Antibody (routine testing w rflx) (Completed)   RPR Qual (Completed)   Cervicovaginal ancillary only   Flu vaccine need       Relevant Orders   Flu Vaccine QUAD 36+ mos IM (Completed)   Screen for sexually transmitted diseases       Relevant Orders   Hepatitis B surface antibody,qualitative (Completed)   HIV Antibody (routine testing w rflx)  (Completed)   RPR Qual (Completed)   Cervicovaginal ancillary only       1) Contraception Patient has had tubal ligation for contraception.  2)  Pap - ASCCP guidelines and rational discussed.  ASCCP guidelines and rational discussed.  Patient opts for every 3 years screening interval  3) Patient underwent screening for postpartum depression with concern for depression, referral offered, but declined by mother  4) Return in about 1 year (around 01/25/2020), or if symptoms worsen or fail to improve, for annual established gyn.   Tresea Mall, CNM [facility] 01/28/2019, 1:46 PM

## 2019-02-03 LAB — CERVICOVAGINAL ANCILLARY ONLY
Bacterial Vaginitis (gardnerella): NEGATIVE
Candida Glabrata: NEGATIVE
Candida Vaginitis: NEGATIVE
Chlamydia: NEGATIVE
Comment: NEGATIVE
Comment: NEGATIVE
Comment: NEGATIVE
Comment: NEGATIVE
Comment: NEGATIVE
Comment: NORMAL
Neisseria Gonorrhea: NEGATIVE
Trichomonas: NEGATIVE

## 2019-04-21 ENCOUNTER — Other Ambulatory Visit: Payer: Self-pay | Admitting: Obstetrics & Gynecology

## 2019-04-21 DIAGNOSIS — A6000 Herpesviral infection of urogenital system, unspecified: Secondary | ICD-10-CM

## 2019-07-12 ENCOUNTER — Telehealth: Payer: Self-pay

## 2019-07-12 NOTE — Telephone Encounter (Signed)
Spoke w/patient. Verified she is still taking ProCardia XL qd (she may miss a day on occasion by accident) She states she had missed the day before her physical at work which is likely why her BP was elevated. She normally see Jeralyn Ruths, but has been unable to get thru on the phones to schedule an apt with them. Please send letter to patient my chart. She will call back if she can get a number/name that it needs to be faxed to for her work.

## 2019-07-12 NOTE — Telephone Encounter (Signed)
Patient had a physical yesterday. Her BP was elevated. She needs a letter stating that she is being treated for her BP. We are the last provider that has seen/monitored her for her BP last year throughout her pregnancy. FU#[phone]

## 2019-07-13 ENCOUNTER — Other Ambulatory Visit: Payer: Self-pay | Admitting: Advanced Practice Midwife

## 2019-07-13 NOTE — Telephone Encounter (Signed)
Pt calling; needs letter for work stating she has been seen for her BP and is on medication.  [phone]

## 2019-07-13 NOTE — Progress Notes (Unsigned)
Work note sent to patient regarding treatment for [facility].

## 2019-07-13 NOTE — Telephone Encounter (Signed)
Letter sent to patient through MyChart regarding treatment for [facility].

## 2019-07-14 NOTE — Telephone Encounter (Signed)
Patient aware/rcvd letter.

## 2019-07-15 ENCOUNTER — Other Ambulatory Visit: Payer: Self-pay | Admitting: Family Medicine

## 2019-07-15 DIAGNOSIS — I10 Essential (primary) hypertension: Secondary | ICD-10-CM

## 2019-07-19 ENCOUNTER — Other Ambulatory Visit: Payer: Self-pay | Admitting: Family Medicine

## 2019-07-19 DIAGNOSIS — I10 Essential (primary) hypertension: Secondary | ICD-10-CM

## 2019-07-22 ENCOUNTER — Ambulatory Visit
Admission: RE | Admit: 2019-07-22 | Discharge: 2019-07-22 | Disposition: A | Discharge status: Home / Self Care | Payer: Medicaid Other | Source: Ambulatory Visit | Attending: Family Medicine | Admitting: Family Medicine

## 2019-07-22 ENCOUNTER — Other Ambulatory Visit: Payer: Self-pay

## 2019-07-22 DIAGNOSIS — I10 Essential (primary) hypertension: Secondary | ICD-10-CM

## 2019-09-01 ENCOUNTER — Ambulatory Visit (INDEPENDENT_AMBULATORY_CARE_PROVIDER_SITE_OTHER): Payer: Medicaid Other | Admitting: Certified Nurse Midwife

## 2019-09-01 ENCOUNTER — Other Ambulatory Visit: Payer: Self-pay

## 2019-09-01 VITALS — BP 122/74 | Ht 68.0 in | Wt 211.0 lb

## 2019-09-01 DIAGNOSIS — N760 Acute vaginitis: Secondary | ICD-10-CM | POA: Diagnosis not present

## 2019-09-01 DIAGNOSIS — B9689 Other specified bacterial agents as the cause of diseases classified elsewhere: Secondary | ICD-10-CM

## 2019-09-01 DIAGNOSIS — Z113 Encounter for screening for infections with a predominantly sexual mode of transmission: Secondary | ICD-10-CM | POA: Diagnosis not present

## 2019-09-01 DIAGNOSIS — N898 Other specified noninflammatory disorders of vagina: Secondary | ICD-10-CM

## 2019-09-01 MED ORDER — METRONIDAZOLE 500 MG PO TABS: 500.0000 mg | ORAL_TABLET | Freq: Two times a day (BID) | ORAL | 0 refills | Status: AC

## 2019-09-01 MED ORDER — FLUCONAZOLE 150 MG PO TABS: ORAL_TABLET | ORAL | 0 refills | Status: DC

## 2019-09-03 ENCOUNTER — Encounter: Payer: Self-pay | Admitting: Certified Nurse Midwife

## 2019-09-03 DIAGNOSIS — I10 Essential (primary) hypertension: Secondary | ICD-10-CM | POA: Insufficient documentation

## 2019-09-03 LAB — POCT WET PREP (WET MOUNT): Trichomonas Wet Prep HPF POC: ABSENT

## 2019-09-03 NOTE — Progress Notes (Signed)
Obstetrics & Gynecology Office Visit   Chief Complaint:  Chief Complaint  Patient presents with  . Exposure to STD    History of Present Illness: 34 year old G4 P3013 presents with complaints of yellowish green mucoid vaginal discharge with an odor that started after the last time she had sex.  She also has had some lower abdominal cramping in the last few days. Has also felt nauseous since her LMP 27 April. Has had sex with only one person in the last couple months, but she is not trusting that her partner only has her for a partner. No fever or dysuria. No diarrhea Current form of contraception is a BTL. 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 Abnormal Pap smear of cervix    2006/2010  . Anxiety   . Chronic hypertension affecting pregnancy 03/30/2018  . Depression   . 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 Genitourinary infection, candidal 09/01/2018  . GERD (gastroesophageal reflux disease)    OTC GUMMIES PRN  . Heart murmur    AS A  CHILD  . 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 Hypertension   . Polyhydramnios affecting pregnancy in third trimester 10/14/2018  . Preterm contractions 04/11/2015  . RLQ abdominal pain 06/09/2018  . Supervision of high risk pregnancy, antepartum 03/30/2018   Clinic [facility] Prenatal Labs Dating  8 wk [place] Blood type: O/Positive/-- (12/30 1157)  Genetic Screen   NIPS: normal XX Antibody:Negative (12/30 1157) Anatomic [place] complete Rubella: 1.80 (12/30 1157) Varicella: Immune GTT Early:   93  Third trimester: nml  RPR: Non Reactive (12/30 1157)  Rhogam  not needed HBsAg: Negative (12/30 1157)  TDaP vaccine       09/03/2018     Flu Shot:no HIV: Non Reactive (  . Tobacco use during pregnancy 07/15/2018    Past Surgical History:  Past Surgical History:  Procedure Laterality Date  .  APPENDECTOMY  april 2016  . CHOLECYSTECTOMY N/A 11/19/2016   Procedure: LAPAROSCOPIC CHOLECYSTECTOMY;  Surgeon: Henrene Dodge, MD;  Location: [facility] ORS;  Service: General;  Laterality: N/A;  . colposcopy of cervix     2007/2010  . DILATION AND CURETTAGE OF UTERUS    . TUBAL LIGATION Bilateral 11/02/2018   Procedure: POST PARTUM TUBAL LIGATION;  Surgeon: Natale Milch, MD;  Location: [facility] ORS;  Service: Gynecology;  Laterality: Bilateral;    Gynecologic History: Patient's last menstrual period was 08/16/2019.  Obstetric History: [id number]  Family History:  Family History  Problem Relation Age of Onset  . Hypertension Mother   . Depression Mother   . Hypertension Father     Social History:  Social History   Socioeconomic History  . Marital status: Single    Spouse name: Not on file  . Number of children: 3  . Years of education: Not on file  . Highest education level: Not on file  Occupational History  . Not on file  Tobacco Use  . Smoking status: Current Every Day Smoker    Packs/day: 0.25    Years: 10.00    Pack years: 2.50    Types: Cigarettes  . Smokeless tobacco: Never Used  Substance and Sexual Activity  . Alcohol use: Not Currently    Comment: OCC  . Drug use: No  . Sexual activity: Yes  Birth control/protection: Surgical    Comment: Tubal Ligatoin  Other Topics Concern  . Not on file  Social History Narrative  . Not on file   Social Determinants of Health   Financial Resource Strain:   . Difficulty of Paying Living Expenses:   Food Insecurity:   . Worried About Programme researcher, broadcasting/film/video in the Last Year:   . Barista in the Last Year:   Transportation Needs:   . Freight forwarder (Medical):   Marland Kitchen Lack of Transportation (Non-Medical):   Physical Activity:   . Days of Exercise per Week:   . Minutes of Exercise per Session:   Stress:   . Feeling of Stress :   Social Connections:   . Frequency of Communication with Friends and Family:   .  Frequency of Social Gatherings with Friends and Family:   . Attends Religious Services:   . Active Member of Clubs or Organizations:   . Attends Banker Meetings:   Marland Kitchen Marital Status:   Intimate Partner Violence:   . Fear of Current or Ex-Partner:   . Emotionally Abused:   Marland Kitchen Physically Abused:   . Sexually Abused:     Allergies:  Allergies  Allergen Reactions  . Lisinopril Cough    Medications: Prior to Admission medications   Medication Sig Start Date End Date Taking? Authorizing Provider  busPIRone (BUSPAR) 10 MG tablet Take 1 tablet (10 mg total) by mouth daily. 01/25/19  Yes Tresea Mall, CNM  hydrochlorothiazide (HYDRODIURIL) 25 MG tablet Take 25 mg by mouth daily. 07/14/19  Yes [provider]  NIFEdipine (PROCARDIA XL/NIFEDICAL-XL) 90 MG 24 hr tablet Take 1 tablet (90 mg total) by mouth daily. 11/08/18  Yes Tresea Mall, CNM  valACYclovir (VALTREX) 500 MG tablet TAKE 1 TABLET(500 MG) BY MOUTH DAILY 04/21/19  Yes Nadara Mustard, MD         ibuprofen (ADVIL) 600 MG tablet Take 1 tablet (600 mg total) by mouth every 6 (six) hours. 11/02/18   Vena Austria, MD         Prenat w/o A Vit-FeFum-FePo-FA (PROVIDA OB) 20-20-1.25 MG CAPS Take 1 tablet by mouth daily. Patient not taking: Reported on 09/01/2019 04/19/18   Tresea Mall, CNM    Physical Exam Vitals:BP 122/74   Ht 5\' 8"  (1.727 m)   Wt 211 lb (95.7 kg)   LMP 08/16/2019   BMI 32.08 kg/m    Patient's last menstrual period was 08/16/2019.  Physical Exam  Constitutional: She is oriented to person, place, and time. She appears well-developed and well-nourished. She appears distressed.  Respiratory: Effort normal.  GI: Soft. Bowel sounds are normal. She exhibits no distension and no mass. There is no abdominal tenderness. There is no guarding.  Genitourinary:    Genitourinary Comments: Vulva: no lesions or inflammation. BUS: no discharge Vagina: clear to off white mucoid discharge Cervix:  negative CMT, not friable, no lesions Uterus: mobile, NT, NSSC Adnexa: no masses, NT    Neurological: She is alert and oriented to person, place, and time.  Skin: Skin is warm and dry.  Psychiatric:  Anxious, tearful at times when talking about HSV   Results for orders placed or performed in visit on 09/01/19 (from the past 72 hour(s))  POCT Wet Prep [facility])     Status: Abnormal   Collection Time: 09/03/19 10:34 PM  Result Value Ref Range   Source Wet Prep POC vagina    WBC, Wet Prep HPF POC moderate  Bacteria Wet Prep HPF POC Many (A) Few   BACTERIA WET PREP MORPHOLOGY POC     Clue Cells Wet Prep HPF POC Many (A) None   Clue Cells Wet Prep Whiff POC     Yeast Wet Prep HPF POC None None   KOH Wet Prep POC     Trichomonas Wet Prep HPF POC Absent Absent    Assessment: 34 y.o. [id number] with bacterial vaginosis R/O Chlamydia or GC  Plan: Flagyl 500 mgm BID x 7 days (no alcohol, with food) Diflcuan 150 mgm every 3 days x 2 doses Aptima Discussed history of HSV and avoiding transmission to a seronegative partner, use of condoms and antiviral and avoiding IC with outbreak.  Dalia Heading

## 2019-09-04 LAB — CHLAMYDIA/GONOCOCCUS/TRICHOMONAS, NAA
Chlamydia by NAA: NEGATIVE
Gonococcus by NAA: NEGATIVE
Trich vag by NAA: NEGATIVE

## 2019-11-02 ENCOUNTER — Telehealth: Payer: Self-pay

## 2019-11-02 NOTE — Telephone Encounter (Signed)
Needs appointment

## 2019-11-02 NOTE — Telephone Encounter (Signed)
Pt calling; BV is back; come in or can she have refill of rx?  [phone]

## 2019-11-03 ENCOUNTER — Other Ambulatory Visit: Payer: Self-pay

## 2019-11-03 ENCOUNTER — Encounter: Payer: Self-pay | Admitting: Obstetrics and Gynecology

## 2019-11-03 ENCOUNTER — Other Ambulatory Visit (HOSPITAL_COMMUNITY)
Admission: RE | Admit: 2019-11-03 | Discharge: 2019-11-03 | Disposition: A | Discharge status: Home / Self Care | Payer: BLUE CROSS/BLUE SHIELD | Source: Ambulatory Visit | Attending: Obstetrics and Gynecology | Admitting: Obstetrics and Gynecology

## 2019-11-03 ENCOUNTER — Ambulatory Visit (INDEPENDENT_AMBULATORY_CARE_PROVIDER_SITE_OTHER): Payer: BC Managed Care – PPO | Admitting: Obstetrics and Gynecology

## 2019-11-03 VITALS — BP 122/74 | Ht 68.0 in | Wt 199.0 lb

## 2019-11-03 DIAGNOSIS — Z113 Encounter for screening for infections with a predominantly sexual mode of transmission: Secondary | ICD-10-CM | POA: Insufficient documentation

## 2019-11-03 DIAGNOSIS — N760 Acute vaginitis: Secondary | ICD-10-CM

## 2019-11-03 DIAGNOSIS — N920 Excessive and frequent menstruation with regular cycle: Secondary | ICD-10-CM

## 2019-11-03 DIAGNOSIS — B9689 Other specified bacterial agents as the cause of diseases classified elsewhere: Secondary | ICD-10-CM | POA: Diagnosis not present

## 2019-11-03 LAB — POCT WET PREP WITH KOH
Clue Cells Wet Prep HPF POC: POSITIVE
KOH Prep POC: POSITIVE — AB
Trichomonas, UA: NEGATIVE
Yeast Wet Prep HPF POC: NEGATIVE

## 2019-11-03 LAB — POCT HEMOGLOBIN: Hemoglobin: 13.8 g/dL (ref 11–14.6)

## 2019-11-03 MED ORDER — METRONIDAZOLE 500 MG PO TABS: 500.0000 mg | ORAL_TABLET | Freq: Two times a day (BID) | ORAL | 0 refills | Status: DC

## 2019-11-03 NOTE — Telephone Encounter (Signed)
Patient is schedule 9:50 with ABC for 11/03/19

## 2019-11-03 NOTE — Progress Notes (Signed)
[name], MD   Chief Complaint  Patient presents with  . Vaginitis    HPI:      Karen Wallace is a 34 y.o. [id number] whose LMP was Patient's last menstrual period was 10/27/2019., presents today for increased vag d/c with odor, no irritation, LBP for the past wk. Notes stomach upset today (same sx with BV in past). Treated for BV with flagyl 5/21 with Colleen with sx relief. No urin sx. Had neg STD testing then but with new partner now. Has had postcoital bleeding past few wks.  Menses are Q3 wks, lasting 7 days, heavy flow with clots, no BTB, mild dysmen, improved with NSAIDs. Flow heavier since PPTL ~1 yr ago.    Past Medical History:  Diagnosis Date  . Abnormal Pap smear of cervix    2006/2010  . Anxiety   . Chronic hypertension affecting pregnancy 03/30/2018  . Depression   . 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 Genitourinary infection, candidal 09/01/2018  . GERD (gastroesophageal reflux disease)    OTC GUMMIES PRN  . Heart murmur    AS A  CHILD  . 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 Hypertension   . Polyhydramnios affecting pregnancy in third trimester 10/14/2018  . Preterm contractions 04/11/2015  . RLQ abdominal pain 06/09/2018  . Supervision of high risk pregnancy, antepartum 03/30/2018   Clinic [facility] Prenatal Labs Dating  8 wk [place] Blood type: O/Positive/-- (12/30 1157)  Genetic Screen   NIPS: normal XX Antibody:Negative (12/30 1157) Anatomic [place] complete Rubella: 1.80 (12/30 1157) Varicella: Immune GTT Early:   93  Third trimester: nml  RPR: Non Reactive (12/30 1157)  Rhogam  not needed HBsAg: Negative (12/30 1157)  TDaP vaccine       09/03/2018     Flu Shot:no HIV: Non Reactive (  . Tobacco use during pregnancy 07/15/2018    Past Surgical History:  Procedure Laterality Date  . APPENDECTOMY  april 2016  . CHOLECYSTECTOMY N/A 11/19/2016   Procedure: LAPAROSCOPIC CHOLECYSTECTOMY;  Surgeon: Henrene Dodge,  MD;  Location: [facility] ORS;  Service: General;  Laterality: N/A;  . colposcopy of cervix     2007/2010  . DILATION AND CURETTAGE OF UTERUS    . TUBAL LIGATION Bilateral 11/02/2018   Procedure: POST PARTUM TUBAL LIGATION;  Surgeon: Natale Milch, MD;  Location: [facility] ORS;  Service: Gynecology;  Laterality: Bilateral;    Family History  Problem Relation Age of Onset  . Hypertension Mother   . Depression Mother   . Hypertension Father     Social History   Socioeconomic History  . Marital status: Single    Spouse name: Not on file  . Number of children: 3  . Years of education: Not on file  . Highest education level: Not on file  Occupational History  . Not on file  Tobacco Use  . Smoking status: Current Every Day Smoker    Packs/day: 0.25    Years: 10.00    Pack years: 2.50    Types: Cigarettes  . Smokeless tobacco: Never Used  Vaping Use  . Vaping Use: Never used  Substance and Sexual Activity  . Alcohol use: Not Currently    Comment: OCC  . Drug use: No  . Sexual activity: Yes    Birth control/protection: Surgical    Comment: Tubal Ligatoin  Other Topics Concern  . Not on file  Social History Narrative  . Not  on file   Social Determinants of Health   Financial Resource Strain:   . Difficulty of Paying Living Expenses:   Food Insecurity:   . Worried About Programme researcher, broadcasting/film/video in the Last Year:   . Barista in the Last Year:   Transportation Needs:   . Freight forwarder (Medical):   Marland Kitchen Lack of Transportation (Non-Medical):   Physical Activity:   . Days of Exercise per Week:   . Minutes of Exercise per Session:   Stress:   . Feeling of Stress :   Social Connections:   . Frequency of Communication with Friends and Family:   . Frequency of Social Gatherings with Friends and Family:   . Attends Religious Services:   . Active Member of Clubs or Organizations:   . Attends Banker Meetings:   Marland Kitchen Marital Status:   Intimate Partner  Violence:   . Fear of Current or Ex-Partner:   . Emotionally Abused:   Marland Kitchen Physically Abused:   . Sexually Abused:     Outpatient Medications Prior to Visit  Medication Sig Dispense Refill  . busPIRone (BUSPAR) 10 MG tablet Take 1 tablet (10 mg total) by mouth daily. 30 tablet 11  . hydrochlorothiazide (HYDRODIURIL) 25 MG tablet Take 25 mg by mouth daily.    Marland Kitchen ibuprofen (ADVIL) 600 MG tablet Take 1 tablet (600 mg total) by mouth every 6 (six) hours. 30 tablet 0  . NIFEdipine (PROCARDIA XL/NIFEDICAL-XL) 90 MG 24 hr tablet Take 1 tablet (90 mg total) by mouth daily. 30 tablet 11  . valACYclovir (VALTREX) 500 MG tablet TAKE 1 TABLET(500 MG) BY MOUTH DAILY 30 tablet 11  . fluconazole (DIFLUCAN) 150 MG tablet TAke one tablet every 3 days (Patient not taking: Reported on 11/03/2019) 2 tablet 0  . Prenat w/o A Vit-FeFum-FePo-FA (PROVIDA OB) 20-20-1.25 MG CAPS Take 1 tablet by mouth daily. (Patient not taking: Reported on 09/01/2019) 30 capsule 11   No facility-administered medications prior to visit.      ROS:  Review of Systems  Constitutional: Negative for fever.  Gastrointestinal: Negative for blood in stool, constipation, diarrhea, nausea and vomiting.  Genitourinary: Positive for vaginal bleeding and vaginal discharge. Negative for dyspareunia, dysuria, flank pain, frequency, hematuria, urgency and vaginal pain.  Musculoskeletal: Positive for back pain.  Skin: Negative for rash.    OBJECTIVE:   Vitals:  BP 122/74   Ht 5\' 8"  (1.727 m)   Wt 199 lb (90.3 kg)   LMP 10/27/2019   BMI 30.26 kg/m   Physical Exam Vitals reviewed.  Constitutional:      Appearance: She is well-developed.  Pulmonary:     Effort: Pulmonary effort is normal.  Genitourinary:    General: Normal vulva.     Pubic Area: No rash.      Labia:        Right: No rash, tenderness or lesion.        Left: No rash, tenderness or lesion.      Vagina: Vaginal discharge present. No erythema or tenderness.      Cervix: Normal.     Uterus: Normal. Not enlarged and not tender.      Adnexa: Right adnexa normal and left adnexa normal.       Right: No mass or tenderness.         Left: No mass or tenderness.    Musculoskeletal:        General: Normal range of motion.  Cervical back: Normal range of motion.  Skin:    General: Skin is warm and dry.  Neurological:     General: No focal deficit present.     Mental Status: She is alert and oriented to person, place, and time.  Psychiatric:        Mood and Affect: Mood normal.        Behavior: Behavior normal.        Thought Content: Thought content normal.        Judgment: Judgment normal.     Results: Results for orders placed or performed in visit on 11/03/19 (from the past 24 hour(s))  POCT hemoglobin     Status: Normal   Collection Time: 11/03/19 10:49 AM  Result Value Ref Range   Hemoglobin 13.8 11 - 14.6 g/dL  POCT Wet Prep with KOH     Status: Abnormal   Collection Time: 11/03/19 10:49 AM  Result Value Ref Range   Trichomonas, UA Negative    Clue Cells Wet Prep HPF POC pos    Epithelial Wet Prep HPF POC     Yeast Wet Prep HPF POC neg    Bacteria Wet Prep HPF POC     RBC Wet Prep HPF POC     WBC Wet Prep HPF POC     KOH Prep POC Positive (A) Negative     Assessment/Plan: Bacterial vaginosis - Plan: POCT Wet Prep with KOH, metroNIDAZOLE (FLAGYL) 500 MG tablet; Pos sx and wet prep. Rx flagyl, no EtOH. Use condoms/withdrawal to prevent recurrence. F/u prn.   Screening for STD (sexually transmitted disease) - Plan: Cervicovaginal ancillary only  Menorrhagia with regular cycle - Plan: POCT hemoglobin; Since TL. WNL HgB. Mentioned that some women need BC, other tx for sx control, prn.    Meds ordered this encounter  Medications  . metroNIDAZOLE (FLAGYL) 500 MG tablet    Sig: Take 1 tablet (500 mg total) by mouth 2 (two) times daily for 7 days.    Dispense:  14 tablet    Refill:  0    Order Specific Question:   Supervising  Provider    Answer:   Nadara Mustard [[id number]]      Return if symptoms worsen or fail to improve.  Alondra Sahni B. Kimbely Whiteaker, PA-C 11/03/2019 10:51 AM

## 2019-11-03 NOTE — Patient Instructions (Signed)
I value your feedback and entrusting us with your care. If you get a [facility] patient survey, I would appreciate you taking the time to let us know about your experience today. Thank you!  As of March 31, 2019, your lab results will be released to your MyChart immediately, before I even have a chance to see them. Please give me time to review them and contact you if there are any abnormalities. Thank you for your patience.  

## 2019-11-04 ENCOUNTER — Encounter: Payer: Self-pay | Admitting: Obstetrics and Gynecology

## 2019-11-07 LAB — CERVICOVAGINAL ANCILLARY ONLY
Chlamydia: NEGATIVE
Comment: NEGATIVE
Comment: NEGATIVE
Comment: NORMAL
Neisseria Gonorrhea: NEGATIVE
Trichomonas: NEGATIVE

## 2019-11-23 ENCOUNTER — Ambulatory Visit (INDEPENDENT_AMBULATORY_CARE_PROVIDER_SITE_OTHER): Payer: BC Managed Care – PPO

## 2019-11-23 ENCOUNTER — Encounter: Payer: Self-pay | Admitting: Obstetrics and Gynecology

## 2019-11-23 ENCOUNTER — Other Ambulatory Visit: Payer: Self-pay

## 2019-11-23 ENCOUNTER — Ambulatory Visit (INDEPENDENT_AMBULATORY_CARE_PROVIDER_SITE_OTHER): Payer: BC Managed Care – PPO | Admitting: Obstetrics and Gynecology

## 2019-11-23 VITALS — BP 110/90 | Ht 68.0 in | Wt 198.0 lb

## 2019-11-23 DIAGNOSIS — N939 Abnormal uterine and vaginal bleeding, unspecified: Secondary | ICD-10-CM

## 2019-11-23 DIAGNOSIS — R102 Pelvic and perineal pain: Secondary | ICD-10-CM

## 2019-11-23 DIAGNOSIS — Z1329 Encounter for screening for other suspected endocrine disorder: Secondary | ICD-10-CM | POA: Diagnosis not present

## 2019-11-23 DIAGNOSIS — Z131 Encounter for screening for diabetes mellitus: Secondary | ICD-10-CM

## 2019-11-23 NOTE — Progress Notes (Signed)
Burns, [name], MD   Chief Complaint  Patient presents with   Menstrual Problem    still getting cycles about every 2 weeks    HPI:      Karen Wallace is a 34 y.o. [id number] whose LMP was Patient's last menstrual period was 11/15/2019 (exact date)., presents today for irregular cycles since 5/21. Menses are about Q17-19 days, lasting 7 days, mod to heavy flow with clots, mild to mod dysmen, improved with tylenol. Has soiled her clothes due to flow. Has had occas BTB as well. Having some pelvic aching before her period starts now. Menses used to be Q3 wks, lasting 7 days, heavy flow with clots, no BTB, mild dysmen, improved with NSAIDs. Flow heavier in general. since PPTL ~1 yr ago. Neg pap 4/18.  She is sex active, no new partners since 5/21. Had neg STD testing 7/21. Denies dyspareunia but has had red bleeding with sex recently. Treated for BV 7/21 with flagyl with sx relief.   Pt has lost wt since 4/21 due to stress.  She has a hx of HTN. Did depo in past, not interested in it again. Had IUD but it moved. Pt would be interested in POPs for cycle control.  No recent labs.  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   Hypertension    Polyhydramnios affecting pregnancy in third trimester 10/14/2018   Preterm contractions 04/11/2015   RLQ abdominal pain 06/09/2018   Supervision of high risk pregnancy, antepartum 03/30/2018   Clinic [facility] Prenatal Labs Dating  8 wk [place] Blood type: O/Positive/-- (12/30 1157)  Genetic Screen   NIPS: normal XX Antibody:Negative (12/30 1157) Anatomic  [place] complete Rubella: 1.80 (12/30 1157) Varicella: Immune GTT Early:   93  Third trimester: nml  RPR: Non Reactive (12/30 1157)  Rhogam  not needed HBsAg: Negative (12/30 1157)  TDaP vaccine       09/03/2018     Flu Shot:no HIV: Non Reactive (   Tobacco use during pregnancy 07/15/2018    Past Surgical History:  Procedure Laterality Date   APPENDECTOMY  april 2016   CHOLECYSTECTOMY N/A 11/19/2016   Procedure: LAPAROSCOPIC CHOLECYSTECTOMY;  Surgeon: Henrene Dodge, MD;  Location: [facility] ORS;  Service: General;  Laterality: N/A;   colposcopy of cervix     2007/2010   DILATION AND CURETTAGE OF UTERUS     TUBAL LIGATION Bilateral 11/02/2018   Procedure: POST PARTUM TUBAL LIGATION;  Surgeon: Natale Milch, MD;  Location: [facility] ORS;  Service: Gynecology;  Laterality: Bilateral;    Family History  Problem Relation Age of Onset   Hypertension Mother    Depression Mother    Hypertension Father     Social History   Socioeconomic History   Marital status: Single    Spouse name: Not on file   Number of children: 3   Years of education: Not on file   Highest education level: Not on file  Occupational History   Not on file  Tobacco Use   Smoking status: Current Every Day Smoker  Packs/day: 0.25    Years: 10.00    Pack years: 2.50    Types: Cigarettes   Smokeless tobacco: Never Used  Vaping Use   Vaping Use: Never used  Substance and Sexual Activity   Alcohol use: Not Currently    Comment: OCC   Drug use: No   Sexual activity: Yes    Birth control/protection: Surgical    Comment: Tubal Ligatoin  Other Topics Concern   Not on file  Social History Narrative   Not on file   Social Determinants of Health   Financial Resource Strain:    Difficulty of Paying Living Expenses:   Food Insecurity:    Worried About Programme researcher, broadcasting/film/video in the Last Year:    Barista in the Last Year:   Transportation Needs:    Freight forwarder (Medical):     Lack of Transportation (Non-Medical):   Physical Activity:    Days of Exercise per Week:    Minutes of Exercise per Session:   Stress:    Feeling of Stress :   Social Connections:    Frequency of Communication with Friends and Family:    Frequency of Social Gatherings with Friends and Family:    Attends Religious Services:    Active Member of Clubs or Organizations:    Attends Engineer, structural:    Marital Status:   Intimate Partner Violence:    Fear of Current or Ex-Partner:    Emotionally Abused:    Physically Abused:    Sexually Abused:     Outpatient Medications Prior to Visit  Medication Sig Dispense Refill   busPIRone (BUSPAR) 10 MG tablet Take 1 tablet (10 mg total) by mouth daily. 30 tablet 11   hydrochlorothiazide (HYDRODIURIL) 25 MG tablet Take 25 mg by mouth daily.     ibuprofen (ADVIL) 800 MG tablet Take 800 mg by mouth every 8 (eight) hours as needed.     NIFEdipine (PROCARDIA XL/NIFEDICAL-XL) 90 MG 24 hr tablet Take 1 tablet (90 mg total) by mouth daily. 30 tablet 11   tiZANidine (ZANAFLEX) 4 MG tablet Take 4 mg by mouth 3 (three) times daily.     valACYclovir (VALTREX) 500 MG tablet TAKE 1 TABLET(500 MG) BY MOUTH DAILY 30 tablet 11   fluconazole (DIFLUCAN) 150 MG tablet TAke one tablet every 3 days (Patient not taking: Reported on 11/03/2019) 2 tablet 0   ibuprofen (ADVIL) 600 MG tablet Take 1 tablet (600 mg total) by mouth every 6 (six) hours. 30 tablet 0   Prenat w/o A Vit-FeFum-FePo-FA (PROVIDA OB) 20-20-1.25 MG CAPS Take 1 tablet by mouth daily. (Patient not taking: Reported on 09/01/2019) 30 capsule 11   No facility-administered medications prior to visit.      ROS:  Review of Systems  Constitutional: Positive for unexpected weight change. Negative for fever.  Gastrointestinal: Negative for blood in stool, constipation, diarrhea, nausea and vomiting.  Genitourinary: Positive for menstrual problem. Negative for dyspareunia,  dysuria, flank pain, frequency, hematuria, urgency, vaginal bleeding, vaginal discharge and vaginal pain.  Musculoskeletal: Negative for back pain.  Skin: Negative for rash.   BREAST: No symptoms   OBJECTIVE:   Vitals:  BP 110/90    Ht 5\' 8"  (1.727 m)    Wt 198 lb (89.8 kg)    LMP 11/15/2019 (Exact Date)    Breastfeeding No    BMI 30.11 kg/m   Physical Exam Vitals reviewed.  Constitutional:      Appearance: She is well-developed.  Pulmonary:     Effort: Pulmonary effort is normal.  Genitourinary:    General: Normal vulva.     Pubic Area: No rash.      Labia:        Right: No rash, tenderness or lesion.        Left: No rash, tenderness or lesion.      Vagina: Bleeding present. No vaginal discharge, erythema or tenderness.     Cervix: Normal.     Uterus: Normal. Not enlarged and not tender.      Adnexa: Right adnexa normal and left adnexa normal.       Right: No mass or tenderness.         Left: No mass or tenderness.    Musculoskeletal:        General: Normal range of motion.     Cervical back: Normal range of motion.  Skin:    General: Skin is warm and dry.  Neurological:     General: No focal deficit present.     Mental Status: She is alert and oriented to person, place, and time.  Psychiatric:        Mood and Affect: Mood normal.        Behavior: Behavior normal.        Thought Content: Thought content normal.        Judgment: Judgment normal.     Results:  ULTRASOUND REPORT  Location: [facility]  Date of Service: 11/23/2019    Indications:Pelvic Pain   Findings:  The uterus is anteverted and measures 8.9 x 5.8 x 4.3 cm. Echo texture is homogenous without evidence of focal masses. The Endometrium measures 6.2 mm.  Right Ovary measures 2.5 x 1.8 x 1.9 cm. It is normal in appearance. Left Ovary measures 2.6 x 1.7 x 1.9 cm. It is normal in appearance. Survey of the adnexa demonstrates no adnexal masses. There is no free fluid in the cul de  sac.  Impression: 1. Normal pelvic ultrasound.   Recommendations: 1.Clinical correlation with the patient's History and Physical Exam.   Deanna Artis, RT  Assessment/Plan: Abnormal uterine bleeding (AUB) - Plan: TSH + free T4, Prolactin, US PELVIS TRANSVAGINAL NON-OB (TV ONLY). Neg labs, neg GYN u/s. Most likely due to recent stress/wt loss. Discussed cycle control with prog only options. Will try POPs. May help with sx. Rx camila. F/u prn. May need to also consider endometrial ablation.   Pelvic pain - Plan: US PELVIS TRANSVAGINAL NON-OB (TV ONLY), neg u/s. Most likely due to irreg cycles.   Screening for diabetes mellitus - Plan: Hemoglobin A1c  Thyroid disorder screening - Plan: TSH + free T4   Meds ordered this encounter  Medications   norethindrone (MICRONOR) 0.35 MG tablet    Sig: Take 1 tablet (0.35 mg total) by mouth daily.    Dispense:  84 tablet    Refill:  0    Order Specific Question:   Supervising Provider    Answer:   Nadara Mustard [[id number]]      Return if symptoms worsen or fail to improve.  Karen Burnsed B. Avereigh Spainhower, PA-C 11/24/2019 12:26 PM

## 2019-11-24 ENCOUNTER — Encounter: Payer: Self-pay | Admitting: Obstetrics and Gynecology

## 2019-11-24 DIAGNOSIS — N939 Abnormal uterine and vaginal bleeding, unspecified: Secondary | ICD-10-CM | POA: Insufficient documentation

## 2019-11-24 LAB — HEMOGLOBIN A1C
Est. average glucose Bld gHb Est-mCnc: 97 mg/dL
Hgb A1c MFr Bld: 5 % (ref 4.8–5.6)

## 2019-11-24 LAB — TSH+FREE T4
Free T4: 1.32 ng/dL (ref 0.82–1.77)
TSH: 0.623 u[IU]/mL (ref 0.450–4.500)

## 2019-11-24 LAB — PROLACTIN: Prolactin: 12.8 ng/mL (ref 4.8–23.3)

## 2019-11-24 MED ORDER — NORETHINDRONE 0.35 MG PO TABS: 1.0000 | ORAL_TABLET | Freq: Every day | ORAL | 0 refills | Status: DC

## 2019-11-24 NOTE — Patient Instructions (Signed)
I value your feedback and entrusting us with your care. If you get a [facility] patient survey, I would appreciate you taking the time to let us know about your experience today. Thank you!  As of March 31, 2019, your lab results will be released to your MyChart immediately, before I even have a chance to see them. Please give me time to review them and contact you if there are any abnormalities. Thank you for your patience.  

## 2019-11-30 ENCOUNTER — Other Ambulatory Visit: Payer: Self-pay | Admitting: Advanced Practice Midwife

## 2019-12-01 NOTE — Telephone Encounter (Signed)
Please advise 

## 2019-12-14 ENCOUNTER — Other Ambulatory Visit: Payer: Self-pay | Admitting: Advanced Practice Midwife

## 2019-12-14 NOTE — Telephone Encounter (Signed)
We are not PCP. Erskine Squibb was managing during pregnancy, but pt needs to be seeing PCP for BP mgmt now. I will RF for 1 mo until she sees PCP.

## 2019-12-14 NOTE — Telephone Encounter (Signed)
Pt aware.

## 2019-12-14 NOTE — Telephone Encounter (Signed)
Pt needs refill on her Blood Pressure  meds

## 2020-01-03 DIAGNOSIS — F411 Generalized anxiety disorder: Secondary | ICD-10-CM | POA: Diagnosis not present

## 2020-01-03 DIAGNOSIS — I10 Essential (primary) hypertension: Secondary | ICD-10-CM | POA: Diagnosis not present

## 2020-01-03 DIAGNOSIS — F17200 Nicotine dependence, unspecified, uncomplicated: Secondary | ICD-10-CM | POA: Diagnosis not present

## 2020-01-03 DIAGNOSIS — E781 Pure hyperglyceridemia: Secondary | ICD-10-CM | POA: Diagnosis not present

## 2020-01-17 DIAGNOSIS — N760 Acute vaginitis: Secondary | ICD-10-CM | POA: Diagnosis not present

## 2020-01-17 DIAGNOSIS — Z1389 Encounter for screening for other disorder: Secondary | ICD-10-CM | POA: Diagnosis not present

## 2020-03-16 ENCOUNTER — Emergency Department
Admission: EM | Admit: 2020-03-16 | Discharge: 2020-03-16 | Disposition: A | Discharge status: Home / Self Care | Payer: BLUE CROSS/BLUE SHIELD | Attending: Emergency Medicine | Admitting: Emergency Medicine

## 2020-03-16 ENCOUNTER — Other Ambulatory Visit: Payer: Self-pay

## 2020-03-16 DIAGNOSIS — M62830 Muscle spasm of back: Secondary | ICD-10-CM | POA: Diagnosis not present

## 2020-03-16 DIAGNOSIS — I10 Essential (primary) hypertension: Secondary | ICD-10-CM | POA: Diagnosis not present

## 2020-03-16 DIAGNOSIS — Y9389 Activity, other specified: Secondary | ICD-10-CM | POA: Diagnosis not present

## 2020-03-16 DIAGNOSIS — Y92410 Unspecified street and highway as the place of occurrence of the external cause: Secondary | ICD-10-CM | POA: Insufficient documentation

## 2020-03-16 DIAGNOSIS — Z79899 Other long term (current) drug therapy: Secondary | ICD-10-CM | POA: Diagnosis not present

## 2020-03-16 DIAGNOSIS — F17210 Nicotine dependence, cigarettes, uncomplicated: Secondary | ICD-10-CM | POA: Diagnosis not present

## 2020-03-16 DIAGNOSIS — M549 Dorsalgia, unspecified: Secondary | ICD-10-CM | POA: Diagnosis present

## 2020-03-16 MED ORDER — METAXALONE 800 MG PO TABS: 800.0000 mg | ORAL_TABLET | Freq: Three times a day (TID) | ORAL | 0 refills | Status: AC

## 2020-03-16 MED ORDER — LIDOCAINE 5 % EX PTCH
1.0000 | MEDICATED_PATCH | Freq: Two times a day (BID) | CUTANEOUS | 0 refills | Status: AC | PRN
Start: 2020-03-16 — End: 2020-03-26

## 2020-03-16 MED ORDER — NAPROXEN 500 MG PO TABS
500.0000 mg | ORAL_TABLET | Freq: Two times a day (BID) | ORAL | 0 refills | Status: AC
Start: 2020-03-16 — End: 2020-03-31
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
## 2020-04-09 ENCOUNTER — Other Ambulatory Visit: Payer: Self-pay | Admitting: Obstetrics and Gynecology

## 2020-04-09 ENCOUNTER — Telehealth: Payer: Self-pay

## 2020-04-09 DIAGNOSIS — N760 Acute vaginitis: Secondary | ICD-10-CM

## 2020-04-09 MED ORDER — METRONIDAZOLE 500 MG PO TABS: 500.0000 mg | ORAL_TABLET | Freq: Two times a day (BID) | ORAL | 0 refills | Status: AC

## 2020-04-09 NOTE — Telephone Encounter (Signed)
Spoke w/patient. She is having d/c w/fishy odor since Friday. Denies itching, vaginal bleeding, irritation, burning. Verified [facility].

## 2020-04-09 NOTE — Telephone Encounter (Signed)
Pt aware.

## 2020-04-09 NOTE — Progress Notes (Signed)
Rx RF flagyl for BV sx.  

## 2020-04-09 NOTE — Telephone Encounter (Signed)
Rx RF eRxd. F/u prn

## 2020-04-09 NOTE — Telephone Encounter (Signed)
Patient requesting rx for BV (has had in the past) it tends to reoccur. She doesn't believe we take her new insurance [employer]. Cb#[phone]

## 2020-05-03 ENCOUNTER — Emergency Department
Admission: EM | Admit: 2020-05-03 | Discharge: 2020-05-03 | Disposition: A | Discharge status: Home / Self Care | Payer: BC Managed Care – PPO | Attending: Emergency Medicine | Admitting: Emergency Medicine

## 2020-05-03 ENCOUNTER — Emergency Department: Payer: BC Managed Care – PPO

## 2020-05-03 ENCOUNTER — Other Ambulatory Visit: Payer: Self-pay

## 2020-05-03 DIAGNOSIS — Z79899 Other long term (current) drug therapy: Secondary | ICD-10-CM | POA: Insufficient documentation

## 2020-05-03 DIAGNOSIS — M545 Low back pain, unspecified: Secondary | ICD-10-CM | POA: Insufficient documentation

## 2020-05-03 DIAGNOSIS — F17210 Nicotine dependence, cigarettes, uncomplicated: Secondary | ICD-10-CM | POA: Diagnosis not present

## 2020-05-03 DIAGNOSIS — I10 Essential (primary) hypertension: Secondary | ICD-10-CM | POA: Diagnosis not present

## 2020-05-03 DIAGNOSIS — M25551 Pain in right hip: Secondary | ICD-10-CM | POA: Insufficient documentation

## 2020-05-03 MED ORDER — PREDNISONE 10 MG (21) PO TBPK: ORAL_TABLET | ORAL | 0 refills | Status: AC
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
## 2020-05-03 NOTE — Discharge Instructions (Addendum)
Follow up with your regular doctor if not improving in 5 days Return to the ER if worsening Call [facility]rop[facility], (Dr Thermon Leyland)

## 2020-05-03 NOTE — ED Triage Notes (Signed)
Pt arrives via pov from home. Pt c/o lower back pain starting Saturday. Pt ambulatory to triage. NAD noted.
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
## 2020-06-16 ENCOUNTER — Other Ambulatory Visit: Payer: Self-pay | Admitting: Obstetrics & Gynecology

## 2020-06-16 ENCOUNTER — Encounter: Payer: Self-pay | Admitting: Obstetrics and Gynecology

## 2020-06-16 DIAGNOSIS — A6000 Herpesviral infection of urogenital system, unspecified: Secondary | ICD-10-CM

## 2020-06-18 ENCOUNTER — Other Ambulatory Visit: Payer: Self-pay

## 2020-06-18 DIAGNOSIS — A6000 Herpesviral infection of urogenital system, unspecified: Secondary | ICD-10-CM

## 2020-06-18 MED ORDER — VALACYCLOVIR HCL 500 MG PO TABS: ORAL_TABLET | ORAL | 11 refills | Status: DC

## 2020-06-22 ENCOUNTER — Encounter: Payer: Self-pay | Admitting: Advanced Practice Midwife

## 2020-06-22 ENCOUNTER — Other Ambulatory Visit (HOSPITAL_COMMUNITY)
Admission: RE | Admit: 2020-06-22 | Discharge: 2020-06-22 | Disposition: A | Discharge status: Home / Self Care | Payer: BC Managed Care – PPO | Source: Ambulatory Visit | Attending: Advanced Practice Midwife | Admitting: Advanced Practice Midwife

## 2020-06-22 ENCOUNTER — Ambulatory Visit (INDEPENDENT_AMBULATORY_CARE_PROVIDER_SITE_OTHER): Payer: BC Managed Care – PPO | Admitting: Advanced Practice Midwife

## 2020-06-22 ENCOUNTER — Other Ambulatory Visit: Payer: Self-pay

## 2020-06-22 VITALS — BP 136/88 | Ht 68.0 in | Wt 208.0 lb

## 2020-06-22 DIAGNOSIS — Z124 Encounter for screening for malignant neoplasm of cervix: Secondary | ICD-10-CM | POA: Insufficient documentation

## 2020-06-22 DIAGNOSIS — Z01419 Encounter for gynecological examination (general) (routine) without abnormal findings: Secondary | ICD-10-CM | POA: Insufficient documentation

## 2020-06-22 NOTE — Progress Notes (Signed)
Gynecology Annual Exam   PCP: Pcp, No  Chief Complaint:  Chief Complaint  Patient presents with  . Annual Exam    History of Present Illness: Patient is a 35 y.o. Karen Wallace presents for annual exam. The patient has complaint today of an increase of herpes outbreaks in the past few months. They are occurring monthly around her cycle. We discussed taking anti-viral daily to prevent. She is no longer with her partner and she is not currently sexually active.  She is [facility] at [facility] for [facility] of hypertension. She is currently on a 3 month treatment of flagyl for treatment of recurring BV per that clinic. She has used boric acid suppositories with good success.  LMP: Patient's last menstrual period was 06/04/2020. Average Interval: regular, 28 days Duration of flow: 7 days Heavy Menses: no Clots: no Intermenstrual Bleeding: no Postcoital Bleeding: not applicable Dysmenorrhea: no  The patient is not currently sexually active. She currently uses tubal ligation for contraception. She denies dyspareunia.  The patient does perform self breast exams.  There is no notable family history of breast or ovarian cancer in her family.  The patient wears seatbelts: yes.   The patient has regular exercise: she walks, is active with her children and at her job, she admits a healthy diet, adequate hydration and about 6 hours sleep per night.    The patient denies current symptoms of depression. She has good coping techniques for life stressors.  Review of Systems: Review of Systems  Constitutional: Negative for chills and fever.  HENT: Negative for congestion, ear discharge, ear pain, hearing loss, sinus pain and sore throat.   Eyes: Negative for blurred vision and double vision.  Respiratory: Negative for cough, shortness of breath and wheezing.   Cardiovascular: Negative for chest pain, palpitations and leg swelling.  Gastrointestinal: Negative for abdominal pain, blood in stool,  constipation, diarrhea, heartburn, melena, nausea and vomiting.  Genitourinary: Negative for dysuria, flank pain, frequency, hematuria and urgency.       Positive for increase in hsv outbreaks  Musculoskeletal: Negative for back pain, joint pain and myalgias.  Skin: Negative for itching and rash.  Neurological: Negative for dizziness, tingling, tremors, sensory change, speech change, focal weakness, seizures, loss of consciousness, weakness and headaches.  Endo/Heme/Allergies: Negative for environmental allergies. Does not bruise/bleed easily.  Psychiatric/Behavioral: Negative for depression, hallucinations, memory loss, substance abuse and suicidal ideas. The patient is not nervous/anxious and does not have insomnia.     Past Medical History:  Patient Active Problem List   Diagnosis Date Noted  . Abnormal uterine bleeding (AUB) 11/24/2019  . Bacterial vaginosis 11/03/2019  . Menorrhagia with regular cycle 11/03/2019  . Essential hypertension 09/03/2019  . Depression 07/15/2018  . Anxiety 07/15/2018  . Tobacco abuse counseling 06/09/2018  . Chronic post-traumatic stress disorder (PTSD) 03/30/2018  . 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 APPENDECTOMY  april 2016  . CHOLECYSTECTOMY N/A 11/19/2016   Procedure: LAPAROSCOPIC CHOLECYSTECTOMY;  Surgeon: Henrene Dodge, MD;  Location: [facility] ORS;  Service: General;  Laterality: N/A;  . colposcopy of cervix     2007/2010  . DILATION AND CURETTAGE OF UTERUS    . TUBAL LIGATION Bilateral 11/02/2018   Procedure: POST PARTUM TUBAL LIGATION;  Surgeon: Natale Milch, MD;  Location: [facility] ORS;  Service: Gynecology;  Laterality: Bilateral;    Gynecologic History:  Patient's last menstrual period was 06/04/2020. Contraception: tubal ligation Last Pap: 2018 Results were: no abnormalities   Obstetric History: [id number]  Family History:  Family  History  Problem Relation Age of Onset  . Hypertension Mother   . Depression Mother   . Hypertension Father     Social History:  Social History   Socioeconomic History  . Marital status: Single    Spouse name: Not on file  . Number of children: 3  . Years of education: Not on file  . Highest education level: Not on file  Occupational History  . Not on file  Tobacco Use  . Smoking status: Current Every Day Smoker    Packs/day: 0.25    Years: 10.00    Pack years: 2.50    Types: Cigarettes  . Smokeless tobacco: Never Used  Vaping Use  . Vaping Use: Never used  Substance and Sexual Activity  . Alcohol use: Not Currently    Comment: OCC  . Drug use: No  . Sexual activity: Yes    Birth control/protection: Surgical    Comment: Tubal Ligatoin  Other Topics Concern  . Not on file  Social History Narrative  . Not on file   Social Determinants of Health   Financial Resource Strain: Not on file  Food Insecurity: Not on file  Transportation Needs: Not on file  Physical Activity: Not on file  Stress: Not on file  Social Connections: Not on file  Intimate Partner Violence: Not on file    Allergies:  Allergies  Allergen Reactions  . Lisinopril Cough    Medications: Prior to Admission medications   Medication Sig Start Date End Date Taking? Authorizing Provider  busPIRone (BUSPAR) 10 MG tablet Take 1 tablet (10 mg total) by mouth daily. 01/25/19  Yes Tresea Mall, CNM  hydrochlorothiazide (HYDRODIURIL) 25 MG tablet Take 25 mg by mouth daily. 07/14/19  Yes [provider]  NIFEdipine (ADALAT CC) 90 MG 24 hr tablet TAKE ONE TABLET BY MOUTH DAILY 12/14/19  Yes Copland, Alicia B, PA-C  NIFEdipine (PROCARDIA XL/NIFEDICAL-XL) 90 MG 24 hr tablet Take 1 tablet (90 mg total) by mouth daily. 11/08/18  Yes Tresea Mall, CNM  valACYclovir (VALTREX) 500 MG tablet TAKE 1 TABLET(500 MG) BY MOUTH DAILY 06/18/20  Yes Tresea Mall, CNM  ibuprofen (ADVIL) 800 MG tablet Take 800  mg by mouth 3 (three) times daily. 05/01/20   [provider]  lidocaine (LIDODERM) 5 % Apply 1 as directed to skin once a day 06/21/20   [provider]  metroNIDAZOLE (METROGEL) 0.75 % vaginal gel Place vaginally. 04/26/20   [provider]  predniSONE (STERAPRED UNI-PAK 21 TAB) 10 MG (21) TBPK tablet Take 6 pills on day one then decrease by 1 pill each day 05/03/20   Faythe Ghee, PA-C    Physical Exam Vitals: Blood pressure 136/88, height 5\' 8"  (1.727 m), weight 208 lb (94.3 kg), last menstrual period 06/04/2020  General: NAD HEENT: normocephalic, anicteric Thyroid: no enlargement, no palpable nodules Pulmonary: No increased work of breathing, CTAB Cardiovascular: RRR, distal pulses 2+ Breast: Breast symmetrical, no tenderness, no palpable nodules or masses, no skin or nipple retraction present, no nipple discharge.  No axillary or supraclavicular lymphadenopathy. Abdomen: NABS, soft, non-tender, non-distended.  Umbilicus without lesions.  No hepatomegaly, splenomegaly or masses palpable. No evidence of hernia  Genitourinary:  External: Normal external female genitalia.  Normal urethral meatus, normal Bartholin's and Skene's glands.    Vagina: Normal vaginal mucosa, no evidence of  prolapse.    Cervix: Grossly normal in appearance, no bleeding, no CMT  Uterus: deferred   Adnexa: deferred  Rectal: deferred  Lymphatic: no evidence of inguinal lymphadenopathy Extremities: no edema, erythema, or tenderness Neurologic: Grossly intact Psychiatric: mood appropriate, affect full   Assessment: 35 y.o. [id number] routine annual exam  Plan: Problem List Items Addressed This Visit   None   Visit Diagnoses    Well woman exam with routine gynecological exam    -  Primary   Relevant Orders   Cytology - PAP   Screening for cervical cancer       Relevant Orders   Cytology - PAP      1) STI screening  was offered and declined  2)  ASCCP guidelines and rationale  discussed.  Patient opts for every 3 years screening interval  3) Contraception - the patient is currently using  tubal ligation.  She is happy with her current form of contraception and plans to continue  4) Routine healthcare maintenance including cholesterol, diabetes screening discussed Declines   5) Rx annual refill of valtrex previously sent  6) Return in about 1 year (around 06/22/2021) for annual established gyn.   Tresea Mall, CNM [facility] 06/22/2020, 4:30 PM

## 2020-06-27 LAB — CYTOLOGY - PAP
Comment: NEGATIVE
Diagnosis: NEGATIVE
High risk HPV: NEGATIVE

## 2021-01-08 ENCOUNTER — Telehealth: Payer: Medicaid Other

## 2021-01-08 NOTE — Telephone Encounter (Signed)
Pt calling to see if she can get a rx for BV; has BV frequently; can she have rx instead of coming in?  [phone]  Pt states sxs are odor and slimy clear d/c.

## 2021-01-09 ENCOUNTER — Other Ambulatory Visit: Payer: Self-pay | Admitting: Advanced Practice Midwife

## 2021-01-09 DIAGNOSIS — N760 Acute vaginitis: Secondary | ICD-10-CM

## 2021-01-09 DIAGNOSIS — B9689 Other specified bacterial agents as the cause of diseases classified elsewhere: Secondary | ICD-10-CM

## 2021-01-09 MED ORDER — METRONIDAZOLE 500 MG PO TABS: 500.0000 mg | ORAL_TABLET | Freq: Two times a day (BID) | ORAL | 0 refills | Status: AC

## 2021-01-09 NOTE — Telephone Encounter (Signed)
Pt calling; was told yesterday note would be sent to provider for rx for BV; hasn't heard anything.  [phone]

## 2021-01-09 NOTE — Progress Notes (Signed)
Rx metronidazole sent per patient request to treat BV- recurring.

## 2021-01-09 NOTE — Telephone Encounter (Signed)
MyChart message sent to patient. Rx sent to treat BV per her request.

## 2021-06-21 ENCOUNTER — Other Ambulatory Visit: Payer: Self-pay | Admitting: Advanced Practice Midwife

## 2021-06-21 DIAGNOSIS — A6000 Herpesviral infection of urogenital system, unspecified: Secondary | ICD-10-CM

## 2023-01-05 ENCOUNTER — Encounter: Payer: Self-pay | Admitting: Advanced Practice Midwife

## 2023-09-21 ENCOUNTER — Other Ambulatory Visit: Payer: Self-pay | Admitting: Family Medicine

## 2023-09-21 DIAGNOSIS — N938 Other specified abnormal uterine and vaginal bleeding: Secondary | ICD-10-CM

## 2023-09-28 ENCOUNTER — Ambulatory Visit
Admission: RE | Admit: 2023-09-28 | Discharge: 2023-09-28 | Disposition: A | Discharge status: Home / Self Care | Source: Ambulatory Visit | Attending: Family Medicine | Admitting: Family Medicine

## 2023-09-28 DIAGNOSIS — N938 Other specified abnormal uterine and vaginal bleeding: Secondary | ICD-10-CM | POA: Diagnosis not present
